# Patient Record
Sex: Female | Born: 1975
Health system: Southern US, Community
[De-identification: ages and names within clinical notes are randomized; demographics above are authoritative.]

## PROBLEM LIST (undated history)

## (undated) DIAGNOSIS — R519 Headache, unspecified: Secondary | ICD-10-CM

## (undated) DIAGNOSIS — Z9049 Acquired absence of other specified parts of digestive tract: Secondary | ICD-10-CM

## (undated) DIAGNOSIS — R51 Headache: Secondary | ICD-10-CM

## (undated) DIAGNOSIS — Z9851 Tubal ligation status: Secondary | ICD-10-CM

## (undated) DIAGNOSIS — E039 Hypothyroidism, unspecified: Secondary | ICD-10-CM

## (undated) HISTORY — DX: Headache, unspecified: R51.9

## (undated) HISTORY — DX: Headache: R51

## (undated) HISTORY — DX: Hypothyroidism, unspecified: E03.9

---

## 2003-07-12 ENCOUNTER — Ambulatory Visit (HOSPITAL_COMMUNITY): Admission: RE | Admit: 2003-07-12 | Discharge: 2003-07-12 | Payer: Self-pay | Admitting: Family Medicine

## 2004-10-30 ENCOUNTER — Inpatient Hospital Stay (HOSPITAL_COMMUNITY): Admission: AD | Admit: 2004-10-30 | Discharge: 2004-11-01 | Payer: Self-pay | Admitting: Obstetrics

## 2005-01-12 ENCOUNTER — Ambulatory Visit: Payer: Self-pay | Admitting: Family Medicine

## 2005-02-25 ENCOUNTER — Ambulatory Visit: Payer: Self-pay | Admitting: Family Medicine

## 2005-03-02 ENCOUNTER — Ambulatory Visit (HOSPITAL_COMMUNITY): Admission: RE | Admit: 2005-03-02 | Discharge: 2005-03-02 | Payer: Self-pay | Admitting: Family Medicine

## 2005-03-09 ENCOUNTER — Ambulatory Visit: Payer: Self-pay | Admitting: Family Medicine

## 2005-04-20 HISTORY — PX: APPENDECTOMY: SHX54

## 2005-05-11 ENCOUNTER — Inpatient Hospital Stay (HOSPITAL_COMMUNITY): Admission: EM | Admit: 2005-05-11 | Discharge: 2005-05-13 | Payer: Self-pay | Admitting: Emergency Medicine

## 2005-05-11 ENCOUNTER — Encounter (INDEPENDENT_AMBULATORY_CARE_PROVIDER_SITE_OTHER): Payer: Self-pay | Admitting: Specialist

## 2005-11-04 ENCOUNTER — Ambulatory Visit: Payer: Self-pay | Admitting: Family Medicine

## 2005-11-09 ENCOUNTER — Ambulatory Visit: Payer: Self-pay | Admitting: *Deleted

## 2006-03-01 ENCOUNTER — Ambulatory Visit: Payer: Self-pay | Admitting: Internal Medicine

## 2006-03-15 ENCOUNTER — Encounter: Payer: Self-pay | Admitting: Internal Medicine

## 2006-03-15 ENCOUNTER — Ambulatory Visit: Payer: Self-pay | Admitting: Internal Medicine

## 2006-04-20 HISTORY — PX: TUBAL LIGATION: SHX77

## 2006-06-18 ENCOUNTER — Ambulatory Visit: Payer: Self-pay | Admitting: Internal Medicine

## 2006-07-28 ENCOUNTER — Emergency Department (HOSPITAL_COMMUNITY): Admission: EM | Admit: 2006-07-28 | Discharge: 2006-07-28 | Payer: Self-pay | Admitting: Emergency Medicine

## 2006-09-02 ENCOUNTER — Encounter: Admission: RE | Admit: 2006-09-02 | Discharge: 2006-09-02 | Payer: Self-pay | Admitting: Obstetrics

## 2007-01-05 ENCOUNTER — Encounter (INDEPENDENT_AMBULATORY_CARE_PROVIDER_SITE_OTHER): Payer: Self-pay | Admitting: *Deleted

## 2007-02-04 ENCOUNTER — Inpatient Hospital Stay (HOSPITAL_COMMUNITY): Admission: RE | Admit: 2007-02-04 | Discharge: 2007-02-07 | Payer: Self-pay | Admitting: Obstetrics

## 2007-02-04 ENCOUNTER — Encounter (INDEPENDENT_AMBULATORY_CARE_PROVIDER_SITE_OTHER): Payer: Self-pay | Admitting: Obstetrics

## 2007-03-11 ENCOUNTER — Inpatient Hospital Stay (HOSPITAL_COMMUNITY): Admission: AD | Admit: 2007-03-11 | Discharge: 2007-03-11 | Payer: Self-pay | Admitting: Obstetrics

## 2007-03-29 ENCOUNTER — Telehealth (INDEPENDENT_AMBULATORY_CARE_PROVIDER_SITE_OTHER): Payer: Self-pay | Admitting: *Deleted

## 2010-09-02 NOTE — Discharge Summary (Signed)
NAMEILY, DENNO                    ACCOUNT NO.:  [id number]   MEDICAL RECORD NO.:  [id number]          PATIENT TYPE:  INP   LOCATION:  9105                          FACILITY:  [facility]   PHYSICIAN:  Kathreen Cosier, M.D.DATE OF BIRTH:  05-16-1975   DATE OF ADMISSION:  02/04/2007  DATE OF DISCHARGE:  02/07/2007                               DISCHARGE SUMMARY   The patient is a 35 year old gravida 4, para 3-0-0-3; [facility] February 15, 2007.  She had previous C-section at term and desired repeat C-section  and tubal ligation.  She underwent repeat C-section, had a female, Apgar 8  and 9, weighing 7 pounds 3 ounces and tubal ligation was also performed.  Postoperatively, she did well.  On admission her hemoglobin was 11.1 and  9.4 postoperatively.  Platelets 227 and 189.  PT/PTT normal.  RPR  negative.  She was discharged home on the third postoperative day  ambulatory on a regular diet to see me in 6 weeks.   DISCHARGE DIAGNOSIS:  Status post repeat low transverse cesarean section  and tubal ligation at term.           ______________________________  Kathreen Cosier, M.D.     BAM/MEDQ  D:  03/02/2007  T:  03/02/2007  Job:  [id number]

## 2010-09-02 NOTE — Op Note (Signed)
NAMEEVOLEHT, HOVATTER                    ACCOUNT NO.:  [id number]   MEDICAL RECORD NO.:  [id number]          PATIENT TYPE:  INP   LOCATION:  9199                          [facility]ACILITY:  [facility]   PHYSICIAN:  Kathreen Cosier, M.D.DATE OF BIRTH:  12-Dec-1975   DATE OF PROCEDURE:  02/04/2007  DATE OF DISCHARGE:                               OPERATIVE REPORT   PREOPERATIVE DIAGNOSIS:  Previous cesarean section at term, desires  repeat and tubal ligation.   SURGEON:  Kathreen Cosier, M.D.   FIRST ASSISTANT:  Charles A. Clearance Coots, M.D.   ANESTHESIA:  Spinal.   PROCEDURE:  The patient placed on the operating table in supine  position.  After spinal administered, abdomen prepped and draped.  The  bladder emptied with a Foley catheter.   A transverse suprapubic incision made through the old scar and carried  down to the rectus fascia.  The fascia cleaned and incised the length of  the incision.  Recti muscles retracted laterally.  Peritoneum incised  longitudinally.  Transverse incision made in the visceral peritoneum  above the bladder.  Bladder mobilized inferiorly.  Transverse lower  uterine incision made.  Fluid clear.  The patient delivered from the LOA  position of a female, Apgars 8 and 9, weighing 7 pounds 3 ounces.  Placenta anterior, removed manually.  Uterine cavity cleaned with dry  laps.  Uterine incision closed in one layer with continuous suture of #1  chromic.  Hemostasis satisfactory.  Bladder flap reattached with 2-0  chromic.  Uterus well-contracted.  Tubes and ovaries normal.   Right tube grasped in midportion with a Babcock clamp.  0 plain suture  placed in the mesosalpinx below the portion of tube within clamp.  This  was tied and approximately 1 inch of tube transected.  Hemostasis  satisfactory.  Procedure done in a similar fashion on the other side.  Abdomen closed in layers, peritoneum continuous suture of 0 chromic,  fascia continuous suture of 0 Dexon, skin closed with  subcuticular  stitch of 4-0 Monocryl.  Blood loss 500 mL.           ______________________________  Kathreen Cosier, M.D.     BAM/MEDQ  D:  02/04/2007  T:  02/06/2007  Job:  [id number]

## 2010-09-05 NOTE — H&P (Signed)
Tracy Blevins, Tracy Blevins               ACCOUNT NO.:  [id number]   MEDICAL RECORD NO.:  [id number]          PATIENT TYPE:  INP   LOCATION:  9176                          [facility]AC[facility]:  [facility]   PHYSICIAN:  Roseanna Rainbow, M.D.DATE OF BIRTH:  December 19, 1975   DATE OF ADMISSION:  10/30/2004  DATE OF DISCHARGE:                                HISTORY & PHYSICAL   CHIEF COMPLAINT:  The patient is a 35 year old, para 2 with an estimated  date of confinement of July 12 now at 40 1/7 weeks complaining of uterine  contractions.   HISTORY OF PRESENT ILLNESS:  Please see the above. She also reports  decreased fetal movement.   ANTEPARTUM PROBLEMS/RISK FACTORS:  Prenatal care with Dr. Gaynell Face. Onset of  care at 20 weeks. She has a history of a previous cesarean delivery with a  successful VBAC.   PRENATAL SCREENS:  Hemoglobin 10.7, hematocrit 32.8, platelets 293,000,  blood type A+, antibody screen negative, RPR nonreactive, rubella immune.  Hepatitis B surface antigen negative, HIV declined. PPD negative, Pap test  within normal limits, gonorrhea and chlamydia negative. One hour GTT 71. GBS  negative on June 4.   PAST OB/GYN HISTORY:  In 2000 she is delivered of a female 6 pounds 8 ounces  via cesarean delivery secondary to a breech presentation. In 2001, she was  delivered of a female 7 pounds fullterm spontaneous vaginal delivery.   PAST MEDICAL HISTORY:  She denies.   PAST SURGICAL HISTORY:  Please see the above.   FAMILY HISTORY:  She denies.   SOCIAL HISTORY:  She is single, she denies any tobacco, ethanol or substance  use.   ALLERGIES:  No known drug allergies.   MEDICATIONS:  Prenatal vitamins.   PHYSICAL EXAMINATION:  VITAL SIGNS:  Stable, afebrile. Blood pressure  116/67.  GENERAL:  Uncomfortable, sterile vaginal exam is per the RN and by me 3-4 cm  dilated, 80% effaced with a vertex at a 0 station. Fetal heart tracing  reassuring. The membranes were artificially ruptured during  the exam with  blood tinged clear fluid.   ASSESSMENT:  Multipara at term with a history of a cesarean delivery and  subsequent VBAC for repeat trial of labor, late latent versus early active  labor. Fetal heart tracing consistent with fetal well being.   PLAN:  Admission, expectant management.       LAJ/MEDQ  D:  10/30/2004  T:  10/30/2004  Job:  [id number]

## 2010-09-05 NOTE — Op Note (Signed)
Tracy Blevins, Tracy Blevins               ACCOUNT NO.:  [id number]   MEDICAL RECORD NO.:  [id number]          PATIENT TYPE:  INP   LOCATION:  5705                         FACILITY:  [facility]   PHYSICIAN:  Velora Heckler, MD      DATE OF BIRTH:  02/11/1976   DATE OF PROCEDURE:  05/11/2005  DATE OF DISCHARGE:                                 OPERATIVE REPORT   PREOPERATIVE DIAGNOSIS:  Acute appendicitis.   POSTOPERATIVE DIAGNOSIS:  Acute appendicitis.   PROCEDURE:  Laparoscopic appendectomy.   SURGEON:  Velora Heckler, MD, FACS   ASSISTANT:  None.   ANESTHESIA:  General.   ESTIMATED BLOOD LOSS:  Minimal.   PREPARATION:  Betadine.   COMPLICATIONS:  None.   INDICATIONS:  The patient is 35 year old Hispanic female presents to the  emergency department with 24-hour history of abdominal pain localized to the  right lower quadrant associated with nausea and vomiting. The patient  developed anorexia. She was seen in the emergency department and noted to  have elevated white blood cell count. CT scan abdomen and pelvis  demonstrated retrocecal appendicitis. General surgery was consulted. The  patient is now prepared for the operating room.   BODY OF REPORT:  The procedure was done in OR number 16 at [facility]. The patient was brought to the operating room, placed in  supine position on the operating room table. Following administration of  general anesthesia the patient is prepped and draped in usual strict aseptic  fashion. After ascertaining that an adequate level of anesthesia been  obtained, a supraumbilical incision was made with a #15 blade. Dissection  was carried down to the fascia. Incision was made the midline fascia and the  peritoneal cavity is entered cautiously. A 0 Vicryl pursestring suture was  placed in the fascia. A Hasson cannula was introduced and secured with  pursestring suture. Abdomen is insufflated carbon dioxide. Laparoscope was  introduced  and the abdomen explored. Operative ports were placed in the  right upper quadrant and left lower quadrant under direct vision. Appendix  is retrocecal. The terminal ileum was mobilized from the lateral peritoneal  attachments by incising the peritoneum with the harmonic scalpel. This  provides for some mobilization of the cecum. The base of the appendix was  identified. A window is created at the base of the appendix using the  Harmonic scalpel for hemostasis. An Endo-GIA stapler was inserted and passed  across the base of the appendix and fired. The mesoappendix was then taken  down with the Harmonic scalpel. Dissection was carried posterior laterally  along the wall of the cecum, taking care to avoid the bowel with the  harmonic scalpel. The appendix was completely freed from the posterior  aspect of the cecum and delivered up and into the peritoneal cavity. It is  placed into an EndoCatch bag and withdrawn through the umbilical port  without difficulty. It was submitted to pathology for review. Right lower  quadrant was copiously irrigated with warm saline. Good hemostasis is noted  along the staple line. Good hemostasis was also  noted along the  mesoappendix. Area was irrigated copiously with warm saline which was  evacuated. Ports were removed under direct vision and good hemostasis was  noted at all port sites. Pneumoperitoneum was evacuated.  0 Vicryl  pursestring sutures tied securely. Port sites were anesthetized with local  anesthetic. All three wounds were closed with interrupted 4-0 Vicryl  subcuticular sutures. Wounds washed and dried and benzoin, Steri-Strips were  applied. Sterile dressings were applied. The patient is awakened from  anesthesia and brought to the recovery room in stable condition. The patient  tolerated the procedure well.      Velora Heckler, MD  Electronically Signed     TMG/MEDQ  D:  05/11/2005  T:  05/12/2005  Job:  [id number]   cc:   Lear Ng, MD  Fax: [phone]

## 2010-09-05 NOTE — H&P (Signed)
Tracy Blevins, Tracy Blevins               ACCOUNT NO.:  [id number]   MEDICAL RECORD NO.:  [id number]          PATIENT TYPE:  INP   LOCATION:  5705                         FACILITY:  [facility]   PHYSICIAN:  Velora Heckler, MD      DATE OF BIRTH:  [birth year]   DATE OF ADMISSION:  05/11/2005  DATE OF DISCHARGE:                                HISTORY & PHYSICAL   CHIEF COMPLAINT:  Right lower quadrant abdominal pain less than 24 hours.   HISTORY OF PRESENT ILLNESS:  [name] is a non-English speaking 35-year-  old Hispanic female patient who developed right lower quadrant pain  yesterday mid day.  This pain has become more progressive since Sunday.  It  has been associated with nausea and vomiting.  She attempted to eat a light  meal at 8 p.m. yesterday, but became anorexic and vomited.  The pain  increased in severity this morning and she presented to the ER.  Clinical  examination was consistent with probable appendicitis with positive right  lower quadrant tenderness with guarding and no rebounding over the  McBurney's point.  She had an elevated white count of greater than 15,000.  Subsequent CT of the abdomen and pelvis did demonstrate retrocecal  appendicitis.   REVIEW OF SYSTEMS:  Patient denies problems with prior abdominal pain or  reflux.  No recent problems with viral syndrome, upper respiratory  infection, fevers, chills, or myalgias.  No hematemesis, hematochezia,  hematuria, or melena.  No lower extremity swelling.  No tachy palpitations.  Please refer to the history of present illness.   SOCIAL HISTORY:  She is not English-speaking.  Her husband is translating at  the bedside.  She does not drink alcohol.  She does not smoke.  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   PAST MEDICAL HISTORY:  None.   PAST SURGICAL HISTORY:  1.  Cesarean section in 2000.  2.  Vaginal delivery in 2001 and 2006.   FAMILY HISTORY:   Noncontributory.   ALLERGIES:  NKDA.   MEDICATIONS:  None.   PHYSICAL EXAMINATION:  GENERAL:  Pleasant female patient complaining of  right lower quadrant abdominal pain worse with movement or palpation.  VITAL SIGNS:  Initial temperature was 100.6, down to 98.1.  Initial pulse  114 down to 71.  This is after pain medications and IV hydration.  Respirations 20 to 24.  Initial BP 108/66 down to 92/66 after IV pain  medications.  Patient has received 1.5 L of Iv fluids in the ER.  HEENT:  Head is normocephalic.  Sclerae not injected.  NECK:  Supple.  No adenopathy.  CHEST:  Bilateral lung sounds are clear to auscultation.  Respiratory effort  unlabored on room air.  CARDIAC:  S1, S2.  No rubs, murmurs, thrills, no gallops.  No JVD.  Carotids  2+.  No bruits.  ABDOMEN:  Soft.  Bowel sounds are present.  There is right lower quadrant  tenderness with guarding.  No rebound in the right lower quadrant  over the  McBurney's point.  No involuntary guarding.  No obvious hepatosplenomegaly.  No masses.  No bruits.  EXTREMITIES:  Symmetrical in appearance without edema, cyanosis, or  clubbing.  Pulses are palpable 2+ bilaterally radial, femoral, and pedal.  SKIN:  Intact without evidence of rashes or lesions.   LABORATORIES AND X-RAY:  A CT of the abdomen has been performed that shows  retrocecal appendicitis.  Urinalysis is negative.  Urine pregnancy is  negative.  Sodium 134, potassium 3.7, CO2 19, glucose 105, BUN 11,  creatinine 0.7.  LFTs are normal.  Lipase 23.  Wet prep negative.  White  count 15,500.  Hemoglobin 11.7, platelets 220,000.  PT 13.9, INR 1.1.   IMPRESSION:  1.  Acute appendicitis.  2.  Metabolic acidosis secondary to recent nausea and vomiting.   PLAN:  1.  Laparoscopic appendectomy per Dr. Gerrit Friends.  Risks and benefits of this      procedure have been explained occupational therapy the patient via her      husband by Dr. Gerrit Friends and they wish to proceed.  Will pursue  this this      afternoon on May 11, 2005.  2.  IV fluid hydration.  D5 normal saline with 20 K at 150/hour.  3.  IV Unasyn 3 g IV q.6h. first dose now preoperatively.  4.  Continue n.p.o. status and will admit for overnight evaluation, probable      advanced diet in the morning if surgery otherwise uncomplicated.      Allison L. Rennis Harding, N.P.      Velora Heckler, MD  Electronically Signed    ALE/MEDQ  D:  05/11/2005  T:  05/11/2005  Job:  [id number]

## 2010-09-26 ENCOUNTER — Inpatient Hospital Stay (INDEPENDENT_AMBULATORY_CARE_PROVIDER_SITE_OTHER)
Admission: RE | Admit: 2010-09-26 | Discharge: 2010-09-26 | Disposition: A | Discharge status: Home / Self Care | Payer: Self-pay | Source: Ambulatory Visit | Attending: Family Medicine | Admitting: Family Medicine

## 2010-09-26 DIAGNOSIS — M545 Low back pain: Secondary | ICD-10-CM

## 2011-01-23 ENCOUNTER — Emergency Department (HOSPITAL_COMMUNITY)
Admission: EM | Admit: 2011-01-23 | Discharge: 2011-01-23 | Disposition: A | Discharge status: Home / Self Care | Payer: Self-pay | Attending: Emergency Medicine | Admitting: Emergency Medicine

## 2011-01-23 ENCOUNTER — Encounter (HOSPITAL_COMMUNITY): Payer: Self-pay | Admitting: Radiology

## 2011-01-23 ENCOUNTER — Emergency Department (HOSPITAL_COMMUNITY): Payer: Self-pay

## 2011-01-23 DIAGNOSIS — R319 Hematuria, unspecified: Secondary | ICD-10-CM | POA: Insufficient documentation

## 2011-01-23 DIAGNOSIS — R509 Fever, unspecified: Secondary | ICD-10-CM | POA: Insufficient documentation

## 2011-01-23 DIAGNOSIS — R10813 Right lower quadrant abdominal tenderness: Secondary | ICD-10-CM | POA: Insufficient documentation

## 2011-01-23 DIAGNOSIS — R109 Unspecified abdominal pain: Secondary | ICD-10-CM | POA: Insufficient documentation

## 2011-01-23 DIAGNOSIS — R110 Nausea: Secondary | ICD-10-CM | POA: Insufficient documentation

## 2011-01-23 DIAGNOSIS — N390 Urinary tract infection, site not specified: Secondary | ICD-10-CM | POA: Insufficient documentation

## 2011-01-23 HISTORY — DX: Acquired absence of other specified parts of digestive tract: Z90.49

## 2011-01-23 HISTORY — DX: Tubal ligation status: Z98.51

## 2011-01-23 LAB — CBC
HCT: 35.7 % — ABNORMAL LOW (ref 36.0–46.0)
Hemoglobin: 12.4 g/dL (ref 12.0–15.0)
MCH: 29.1 pg (ref 26.0–34.0)
MCHC: 34.7 g/dL (ref 30.0–36.0)
MCV: 83.8 fL (ref 78.0–100.0)
Platelets: 195 10*3/uL (ref 150–400)
RBC: 4.26 MIL/uL (ref 3.87–5.11)
RDW: 13.1 % (ref 11.5–15.5)
WBC: 12.5 10*3/uL — ABNORMAL HIGH (ref 4.0–10.5)

## 2011-01-23 LAB — URINALYSIS, ROUTINE W REFLEX MICROSCOPIC
Bilirubin Urine: NEGATIVE
Glucose, UA: NEGATIVE mg/dL
Ketones, ur: 15 mg/dL — AB
Nitrite: POSITIVE — AB
Protein, ur: 30 mg/dL — AB
Urobilinogen, UA: 0.2 mg/dL (ref 0.0–1.0)
pH: 7 (ref 5.0–8.0)

## 2011-01-23 LAB — DIFFERENTIAL
Basophils Absolute: 0 10*3/uL (ref 0.0–0.1)
Eosinophils Relative: 0 % (ref 0–5)
Lymphocytes Relative: 10 % — ABNORMAL LOW (ref 12–46)
Lymphs Abs: 1.2 10*3/uL (ref 0.7–4.0)
Monocytes Absolute: 1.2 10*3/uL — ABNORMAL HIGH (ref 0.1–1.0)
Monocytes Relative: 10 % (ref 3–12)
Neutro Abs: 10 10*3/uL — ABNORMAL HIGH (ref 1.7–7.7)
Neutrophils Relative %: 80 % — ABNORMAL HIGH (ref 43–77)

## 2011-01-23 LAB — URINE MICROSCOPIC-ADD ON

## 2011-01-23 LAB — COMPREHENSIVE METABOLIC PANEL
AST: 30 U/L (ref 0–37)
Albumin: 3.8 g/dL (ref 3.5–5.2)
BUN: 13 mg/dL (ref 6–23)
CO2: 24 mEq/L (ref 19–32)
Chloride: 101 mEq/L (ref 96–112)
Creatinine, Ser: 0.68 mg/dL (ref 0.50–1.10)
GFR calc Af Amer: >90 mL/min (ref >90)
GFR calc non Af Amer: >90 mL/min (ref >90)
Total Bilirubin: 0.7 mg/dL (ref 0.3–1.2)

## 2011-01-23 LAB — GLUCOSE, CAPILLARY: Glucose-Capillary: 110 mg/dL — ABNORMAL HIGH (ref 70–99)

## 2011-01-23 LAB — POCT PREGNANCY, URINE: Preg Test, Ur: NEGATIVE

## 2011-01-28 LAB — CBC
HCT: 26.5 — ABNORMAL LOW
HCT: 32.1 — ABNORMAL LOW
Hemoglobin: 11.1 — ABNORMAL LOW
Hemoglobin: 9.4 — ABNORMAL LOW
MCHC: 34.7
MCHC: 35.3
MCV: 88.6
MCV: 88.8
Platelets: 189
Platelets: 227
RBC: 2.98 — ABNORMAL LOW
RBC: 3.62 — ABNORMAL LOW
RDW: 13.3
RDW: 13.5
WBC: 7.8
WBC: 8.2

## 2011-01-28 LAB — DIFFERENTIAL
Basophils Absolute: 0
Basophils Relative: 0
Eosinophils Absolute: 0.1
Eosinophils Relative: 1
Lymphocytes Relative: 27
Lymphs Abs: 2.1
Monocytes Absolute: 0.4
Monocytes Relative: 5
Neutro Abs: 5.2
Neutrophils Relative %: 67

## 2011-01-28 LAB — PROTIME-INR
INR: 0.9
Prothrombin Time: 12.1

## 2011-01-28 LAB — APTT: aPTT: 27

## 2011-01-28 LAB — RPR: RPR Ser Ql: NONREACTIVE

## 2013-08-18 ENCOUNTER — Ambulatory Visit: Payer: Self-pay | Attending: Internal Medicine

## 2014-09-24 ENCOUNTER — Ambulatory Visit: Payer: Self-pay

## 2014-10-24 ENCOUNTER — Ambulatory Visit: Payer: Self-pay | Attending: Internal Medicine

## 2016-06-18 ENCOUNTER — Emergency Department (HOSPITAL_COMMUNITY): Payer: Self-pay

## 2016-06-18 ENCOUNTER — Other Ambulatory Visit: Payer: Self-pay

## 2016-06-18 ENCOUNTER — Encounter (HOSPITAL_COMMUNITY): Payer: Self-pay

## 2016-06-18 DIAGNOSIS — F419 Anxiety disorder, unspecified: Secondary | ICD-10-CM | POA: Insufficient documentation

## 2016-06-18 LAB — CBC
HCT: 34 % — ABNORMAL LOW (ref 36.0–46.0)
HEMOGLOBIN: 11.2 g/dL — AB (ref 12.0–15.0)
MCH: 27.7 pg (ref 26.0–34.0)
MCHC: 32.9 g/dL (ref 30.0–36.0)
MCV: 84 fL (ref 78.0–100.0)
PLATELETS: 273 10*3/uL (ref 150–400)
RBC: 4.05 MIL/uL (ref 3.87–5.11)
RDW: 13.6 % (ref 11.5–15.5)
WBC: 7.6 10*3/uL (ref 4.0–10.5)

## 2016-06-18 LAB — BASIC METABOLIC PANEL
ANION GAP: 12 (ref 5–15)
BUN: 14 mg/dL (ref 6–20)
CHLORIDE: 103 mmol/L (ref 101–111)
CO2: 23 mmol/L (ref 22–32)
CREATININE: 0.66 mg/dL (ref 0.44–1.00)
Calcium: 9.5 mg/dL (ref 8.9–10.3)
GFR calc non Af Amer: >60 mL/min (ref >60)
Glucose, Bld: 92 mg/dL (ref 65–99)
POTASSIUM: 3.6 mmol/L (ref 3.5–5.1)
SODIUM: 138 mmol/L (ref 135–145)

## 2016-06-18 LAB — HCG, QUANTITATIVE, PREGNANCY: hCG, Beta Chain, Quant, S: <1 m[IU]/mL (ref <5)

## 2016-06-18 LAB — TROPONIN I: Troponin I: <0.03 ng/mL (ref <0.03)

## 2016-06-18 NOTE — ED Triage Notes (Addendum)
Pt endorses left side chest pain with radiation to the left shoulder and left side of the neck that came on yesterday, then went away last night and came back at noon today. . Pt also endorses nausea and shob. Pt speaks limited english. Breath sounds clear. VSS.

## 2016-06-19 ENCOUNTER — Emergency Department (HOSPITAL_COMMUNITY)
Admission: EM | Admit: 2016-06-19 | Discharge: 2016-06-19 | Disposition: A | Discharge status: Home / Self Care | Payer: Self-pay | Attending: Emergency Medicine | Admitting: Emergency Medicine

## 2016-06-19 DIAGNOSIS — R0789 Other chest pain: Secondary | ICD-10-CM

## 2016-06-19 DIAGNOSIS — F419 Anxiety disorder, unspecified: Secondary | ICD-10-CM

## 2016-06-19 LAB — I-STAT TROPONIN, ED: TROPONIN I, POC: 0 ng/mL (ref 0.00–0.08)

## 2016-06-19 MED ORDER — HYDROXYZINE HCL 25 MG PO TABS: 25.0000 mg | ORAL_TABLET | ORAL | 0 refills | Status: DC | PRN

## 2016-06-19 MED ORDER — HYDROXYZINE HCL 25 MG PO TABS
50.0000 mg | ORAL_TABLET | Freq: Once | ORAL | Status: AC
  Administered 2016-06-19: 50 mg via ORAL
  Filled 2016-06-19: qty 2

## 2016-06-19 NOTE — Discharge Instructions (Addendum)
Please follow with your primary care doctor in the next 2 days for a check-up. They must obtain records for further management.  ° °Do not hesitate to return to the Emergency Department for any new, worsening or concerning symptoms.  ° °

## 2016-06-19 NOTE — ED Provider Notes (Signed)
[facility]-EMERGENCY DEPT Provider Note   CSN: [id number] Arrival date & time: 06/18/16  2154     History   Chief Complaint Chief Complaint  Patient presents with  . Chest Pain   HPI   Blood pressure 122/74, pulse 69, temperature 97.6 F (36.4 C), temperature source Oral, resp. rate 21, height 5\' 7"  (1.702 m), weight 70.3 kg, last menstrual period 06/11/2016, SpO2 100 %.  Tracy Blevins is a 41 y.o. female complaining of left-sided chest pain onset this afternoon, now completely resolved. He states that initially it radiated to the left shoulder and left neck, she's had this intermittently over the last several weeks he came back today. Initially felt nauseous but this is resolved. She denies shortness of breath (note that this contradicts triage note), positive family history of ACS with father having heart attacks. States she feels stressed and jittery and is requesting anxiety medication.   Past Medical History:  Diagnosis Date  . S/P appy   . S/P tubal ligation     There are no active problems to display for this patient.   History reviewed. No pertinent surgical history.  OB History    No data available      Home Medications    Prior to Admission medications   Medication Sig Start Date End Date Taking? Authorizing Provider  hydrOXYzine (ATARAX/VISTARIL) 25 MG tablet Take 1 tablet (25 mg total) by mouth every 4 (four) hours as needed for anxiety. 06/19/16   Joni Reining Sherwin Hollingshed, PA-C    Family History History reviewed. No pertinent family history.  Social History Social History  Substance Use Topics  . Smoking status: Never Smoker  . Smokeless tobacco: Never Used  . Alcohol use No     Allergies   Patient has no known allergies.   Review of Systems Review of Systems  10 systems reviewed and found to be negative, except as noted in the HPI.   Physical Exam Updated Vital Signs BP 111/78   Pulse 68   Temp 97.6 F (36.4 C) (Oral)   Resp 17   Ht 5\' 7"   (1.702 m)   Wt 70.3 kg   LMP 06/11/2016 (Approximate)   SpO2 100%   BMI 24.28 kg/m   Physical Exam  Constitutional: She is oriented to person, place, and time. She appears well-developed and well-nourished. No distress.  HENT:  Head: Normocephalic and atraumatic.  Mouth/Throat: Oropharynx is clear and moist.  Eyes: Conjunctivae and EOM are normal. Pupils are equal, round, and reactive to light.  Neck: Normal range of motion. No JVD present. No tracheal deviation present.  Cardiovascular: Normal rate, regular rhythm and intact distal pulses.   Radial pulse equal bilaterally  Pulmonary/Chest: Effort normal and breath sounds normal. No stridor. No respiratory distress. She has no wheezes. She has no rales. She exhibits no tenderness.  Abdominal: Soft. She exhibits no distension and no mass. There is no tenderness. There is no rebound and no guarding.  Musculoskeletal: Normal range of motion. She exhibits no edema or tenderness.  No calf asymmetry, superficial collaterals, palpable cords, edema, Homans sign negative bilaterally.    Neurological: She is alert and oriented to person, place, and time.  Skin: Skin is warm. She is not diaphoretic.  Psychiatric: She has a normal mood and affect.  Nursing note and vitals reviewed.    ED Treatments / Results  Labs (all labs ordered are listed, but only abnormal results are displayed) Labs Reviewed  CBC - Abnormal; Notable for the following:  Result Value   Hemoglobin 11.2 (*)    HCT 34.0 (*)    All other components within normal limits  BASIC METABOLIC PANEL  TROPONIN I  HCG, QUANTITATIVE, PREGNANCY  I-STAT TROPOININ, ED    EKG  EKG Interpretation  Date/Time:  Friday June 19 2016 02:48:17 EST Ventricular Rate:  60 PR Interval:  162 QRS Duration: 118 QT Interval:  408 QTC Calculation: 408 R Axis:   16 Text Interpretation:  Sinus rhythm Atrial premature complex Nonspecific intraventricular conduction delay No significant  change since last tracing Confirmed by WARD,  DO, KRISTEN [phone]([id number]) on 06/19/2016 2:50:05 AM       Radiology Dg Chest 2 View  Result Date: 06/18/2016 CLINICAL DATA:  Left-sided chest pain. Nausea and shortness of breath. EXAM: CHEST  2 VIEW COMPARISON:  None. FINDINGS: Low lung volumes. The cardiomediastinal contours are normal. Mild streaky bibasilar atelectasis. Pulmonary vasculature is normal. No consolidation, pleural effusion, or pneumothorax. No acute osseous abnormalities are seen. IMPRESSION: Low lung volumes with streaky bibasilar atelectasis. Electronically Signed   By: Rubye OaksMelanie  Ehinger M.D.   On: 06/18/2016 22:27    Procedures Procedures (including critical care time)  Medications Ordered in ED Medications  hydrOXYzine (ATARAX/VISTARIL) tablet 50 mg (50 mg Oral Given 06/19/16 0249)     Initial Impression / Assessment and Plan / ED Course  I have reviewed the triage vital signs and the nursing notes.  Pertinent labs & imaging results that were available during my care of the patient were reviewed by me and considered in my medical decision making (see chart for details).     Vitals:   06/19/16 0152 06/19/16 0200 06/19/16 0230 06/19/16 0418  BP: 107/65 113/73 122/74 111/78  Pulse: 74 67 69 68  Resp:  23 21 17   Temp:      TempSrc:      SpO2: 98% 98% 100% 100%  Weight:      Height:        Medications  hydrOXYzine (ATARAX/VISTARIL) tablet 50 mg (50 mg Oral Given 06/19/16 0249)    Tracy Blevins is 41 y.o. female presenting with  chest pain, now fully resolved. EKG without acute abnormality, Trop and CXR negative. Chest pain has fully resolved. This patient is low risk by heart score. She does feel anxious, I've given her Atarax for comfort. Will check delta troponin.  Case management is consulted patient can establish outpatient primary care. She's remained chest pain-free she feels much better after the Atarax. Delta troponin negative.  Evaluation does not show  pathology that would require ongoing emergent intervention or inpatient treatment. Pt is hemodynamically stable and mentating appropriately. Discussed findings and plan with patient/guardian, who agrees with care plan. All questions answered. Return precautions discussed and outpatient follow up given.    Final Clinical Impressions(s) / ED Diagnoses   Final diagnoses:  Atypical chest pain  Anxiety    New Prescriptions Discharge Medication List as of 06/19/2016  3:48 AM    START taking these medications   Details  hydrOXYzine (ATARAX/VISTARIL) 25 MG tablet Take 1 tablet (25 mg total) by mouth every 4 (four) hours as needed for anxiety., Starting Fri 06/19/2016, [employer]         Doll Frazee, PA-C 06/19/16 [id number]    Layla MawKristen N Ward, DO 06/23/16 [id number]

## 2016-06-25 ENCOUNTER — Encounter: Payer: Self-pay | Admitting: Physician Assistant

## 2016-06-25 ENCOUNTER — Other Ambulatory Visit: Payer: Self-pay

## 2016-06-25 ENCOUNTER — Ambulatory Visit: Payer: Self-pay | Attending: Internal Medicine | Admitting: Physician Assistant

## 2016-06-25 VITALS — BP 114/77 | HR 89 | Temp 98.6°F | Resp 16 | Wt 180.2 lb

## 2016-06-25 DIAGNOSIS — R002 Palpitations: Secondary | ICD-10-CM | POA: Insufficient documentation

## 2016-06-25 DIAGNOSIS — Z8249 Family history of ischemic heart disease and other diseases of the circulatory system: Secondary | ICD-10-CM | POA: Insufficient documentation

## 2016-06-25 DIAGNOSIS — R079 Chest pain, unspecified: Secondary | ICD-10-CM | POA: Insufficient documentation

## 2016-06-25 DIAGNOSIS — N926 Irregular menstruation, unspecified: Secondary | ICD-10-CM | POA: Insufficient documentation

## 2016-06-25 DIAGNOSIS — Z9851 Tubal ligation status: Secondary | ICD-10-CM | POA: Insufficient documentation

## 2016-06-25 DIAGNOSIS — D649 Anemia, unspecified: Secondary | ICD-10-CM | POA: Insufficient documentation

## 2016-06-25 LAB — IRON AND TIBC
%SAT: 20 % (ref 11–50)
Iron: 60 ug/dL (ref 40–190)
TIBC: 300 ug/dL (ref 250–450)
UIBC: 240 ug/dL (ref 125–400)

## 2016-06-25 LAB — THYROID PANEL WITH TSH
FREE THYROXINE INDEX: 1.9 (ref 1.4–3.8)
T3 UPTAKE: 27 % (ref 22–35)
T4, Total: 7.1 ug/dL (ref 4.5–12.0)
TSH: 1.83 m[IU]/L

## 2016-06-25 LAB — FERRITIN: FERRITIN: 9 ng/mL — AB (ref 10–154)

## 2016-06-25 MED ORDER — HYDROXYZINE HCL 25 MG PO TABS: 25.0000 mg | ORAL_TABLET | ORAL | 0 refills | Status: DC | PRN

## 2016-06-25 NOTE — Patient Instructions (Addendum)
Angina de pecho (Angina Pectoris) La angina de pecho es una sensacin de molestia extrema en el pecho, el cuello o el brazo. El mdico podr llamarla simplemente angina. Hay cuatro tipos de angina de pecho. La angina es causada por la falta de sangre en la capa media y ms gruesa de la pared del corazn (miocardio). Se puede sentir como un dolor que aplasta o retuerce en el pecho. Se puede sentir Insurance account manager u opresin fuerte en el pecho. Algunas personas dicen que se siente como empacho, acidez estomacal o gases. Algunas personas pueden tener sntomas diferentes al [employer] Estos incluyen los siguientes:  Falta de [place].  Sudor fro.  Ganas de vomitar (nuseas).  Sensacin de desvanecimiento. Muchas mujeres tienen [employer] pecho y algunos de los otros sntomas. Sin embargo, a Research scientist (medical), como:  Sensacin de Merchant navy officer (fatiga).  Nerviosismo o preocupacin sin causa aparente.  Debilidad sin causa aparente.  Mareos o [employer]. Las mujeres pueden tener angina sin presentar sntomas. CUIDADOS EN EL HOGAR  Tome los medicamentos solamente como se lo haya indicado el mdico.  Cudese de otras afecciones como se lo haya indicado su mdico. Estos incluyen los siguientes:  Presin arterial elevada (hipertensin).  Diabetes.  Siga una dieta cardiosaludable. El mdico puede ayudarle a elegir opciones de alimentos saludables y a Forensic psychologist.  Consulte a su mdico para conocer ms sobre mtodos de coccin saludables y selos. Estos incluyen los siguientes:  Asar.  Grillar.  Hervir.  Hornear.  Escalfar.  Cocer al vapor.  Saltear.  Siga un programa de ejercicios autorizado por su mdico.  Mantenga un peso saludable. Pierda peso como se lo haya indicado su mdico.  Descanse cuando se sienta cansado.  Aprenda a Dealer.  No consuma ningn producto que contenga tabaco, como cigarrillos, tabaco de Theatre manager o Administrator, Civil Service.  Si necesita ayuda para dejar de fumar, consulte al mdico.  Si bebe alcohol y su mdico lo autoriza, limtese a no ms de 1 vaso por da. Una medida equivale a 12onzas de cerveza, 5onzas de vino o 1onzas de bebidas alcohlicas de alta graduacin.  No consuma drogas.  Concurra a todas las visitas de control como se lo haya indicado el mdico. Esto es importante. No tome los siguientes medicamentos a menos que el mdico lo autorice:  Antiinflamatorios no esteroides (Murriel Hopper), como:  Ibuprofeno.  Naproxeno.  Celecoxib.  Suplementos vitamnicos que contienen vitamina A, vitamina E o ambas.  Tratamientos de reposicin hormonal que contienen estrgeno con o sin progestina. SOLICITE AYUDA DE INMEDIATO SI:  Tiene dolor en el pecho, el cuello, el brazo, la Pablo, el estmago o la espalda que:  Dura ms de unos minutos.  Se repite.  No mejora despus de tomar medicamentos sublinguales (nitroglicerina sublingual).  Tiene alguno de estos sntomas sin ningn motivo:  Gases, Merchant navy officer, empacho.  Sudoracin abundante.  Falta de aire o dificultad para respirar.  Malestar estomacal y vmitos.  Sentirse ms cansado que de [place].  Sentirse nervioso o ms preocupado que lo habitual.  Sensacin de debilidad.  Diarrea.  Se siente mareado o sufre un desmayo de forma repentina.  Se desmaya o pierde el conocimiento. Estos sntomas pueden Customer service manager. No espere hasta que los sntomas desaparezcan. Solicite atencin mdica de inmediato. Comunquese con el servicio de emergencias de su localidad (911 en los [place]). No conduzca por sus propios medios [employer]. Esta informacin no tiene Theme park manager el consejo del mdico. Asegrese de  hacerle al mdico cualquier pregunta que tenga. Document Released: 03/23/2012 Document Revised: 04/27/2014 Document Reviewed: 08/08/2013 Elsevier Interactive Patient Education  2017 [employer]vier  Inc. Palpitaciones (Palpitations) La palpitacin es la sensacin de que el corazn presenta estas caractersticas:  Tiene latidos cardacos irregulares.  Late ms rpido de lo normal.  Tiene un aleteo.  Saltea un latido cardaco. Generalmente no es un problema grave. En algunos casos podra necesitar hacer ms estudios diagnsticos. CUIDADOS EN EL HOGAR  Evite lo siguiente:  La cafena que contienen [address] caf, el t, las Annetta Southgaseosas, los diurticos y las bebidas energizantes.  Chocolate.  Alcohol.  No use productos que contengan tabaco. Estos incluyen cigarrillos, tabaco para mascar y Administrator, Civil Servicecigarrillos electrnicos. Si necesita ayuda para dejar de fumar, consulte al American Expressmdico.  Trate de reducir los niveles de estrs. Lo siguiente puede ser de ayuda:  Education administratorracticar yoga.  Meditacin.  Realizar actividad fsica. Nadar, trotar y caminar son buenas opciones.  Un mtodo que ayuda a usar la mente para Chief Operating Officercontrolar cosas del cuerpo, como los latidos cardacos (biorregulacin).  Descanse y duerma lo suficiente.  Tome los medicamentos de venta libre y los recetados solamente como se lo haya indicado el mdico.  OceanographerConcurra a todas las visitas de control como se lo haya indicado el mdico. Esto es importante. SOLICITE AYUDA SI:  Su corazn sigue latiendo rpido o de forma irregular despus de 24horas.  Las Smith Internationalpalpitaciones le suceden con ms frecuencia. SOLICITE AYUDA DE INMEDIATO SI:  Siente dolor en el pecho.  Le falta el aire.  Siente un dolor de cabeza muy intenso.  Siente mareos.  Pierde el conocimiento (se desmaya). Esta informacin no tiene Theme park managercomo fin reemplazar el consejo del mdico. Asegrese de hacerle al mdico cualquier pregunta que tenga. Document Released: 05/09/2010 Document Revised: 07/29/2015 Document Reviewed: 12/20/2014 Elsevier Interactive Patient Education  2017 [employer] Inc.

## 2016-06-25 NOTE — Progress Notes (Signed)
[address] [name], is a 41 y.o. female  GEX:[id number]  [id number]:[id number]  DOB - 1975/06/29  Subjective:  Chief Complaint and HPI: Tracy Blevins is a 41 y.o. female here today to establish care and for a follow up visit After being seen in the ED on 06/19/2016.  She presented to the ED c/o L sided CP that had been occurring for several weeks and was radiating into the L shoulder and L neck.  +some nausea, no vomiting.  EKG did not show ischemic changes and cardiac enzymes were negative.  Hgb 11.2.  BMP WNL.  She asked for medication for anxiety and was given Atarax which helped with her symptoms.  [employer] interpreters "Tracy Blevins" is translating.  No DOE.  Today she continues to c/o L sided CP.  The pain comes and goes.  She does tell me she has SOB when the pain comes.  The pain initially started in November and would occur 1-2 times/week and has continued but has been occurring even more frequently over the last month.  Someitmes the episodes last a few minutes and sometimes several hours. The pain feels like pressure.  There is also a sensation of irregular heartbeat.  She denies radiating pain to me.  No paresthesias.No weakness.  She denies N/V.  No diaphoresis.  Dad had an MI at age 39.    She denies any new stresses other than her husband has been having some health issues and having to go back and forth to the doctor a lot since around October.    After further investigation, out turns out that she had thyroid problems about 7-8 yrs ago and [place] and [place] meds for a while, but then she felt better and stopped the meds and never had f/up.    She c/o irregular periods now for several months.   [facility] 06/25/2016  Decreased Interest (No Data)  Down, Depressed, Hopeless 1  PHQ - 2 Score 1  Altered sleeping 0  Tired, decreased energy 1  Change in appetite 0  Feeling bad or failure about yourself  0  Trouble concentrating 1  Moving  slowly or fidgety/restless (No Data)  Suicidal thoughts 0  PHQ-9 Score 3   GAD 7 : Generalized Anxiety Score 06/25/2016  Nervous, Anxious, on Edge 2  Control/stop worrying 1  Worry too much - different things 1  Trouble relaxing 1  Restless 1  Easily annoyed or irritable 1  Afraid - awful might happen 0  Total GAD 7 Score 7   ED/Hospital notes reviewed.  EKG with PACs.  Hgb  11.2 Social History:  married Family history:  Dad with MI at 7050; other relatives on dad's side with cardiac issues.  ROS:   Constitutional:  No f/c, No night sweats, No unexplained weight loss. EENT:  No vision changes, No blurry vision, No hearing changes. No mouth, throat, or ear problems.  Respiratory: No cough, No SOB Cardiac: + CP, + palpitations GI:  No abd pain, No N/V/D. GU: No Urinary s/sx Musculoskeletal: No joint pain Neuro: No headache, no dizziness, no motor weakness.  Skin: No rash Endocrine:  No polydipsia. No polyuria.  Psych: Denies SI/HI  No problems updated.  ALLERGIES: No Known Allergies  PAST MEDICAL HISTORY: Past Medical History:  Diagnosis Date  . S/P appy   . S/P tubal ligation     MEDICATIONS AT HOME: Prior to Admission medications   Medication Sig Start Date End Date  Taking? Authorizing Provider  hydrOXYzine (ATARAX/VISTARIL) 25 MG tablet Take 1 tablet (25 mg total) by mouth every 4 (four) hours as needed for anxiety. 06/19/16   Nicole Pisciotta, PA-C     Objective:  EXAM:   Vitals:   06/25/16 1358  BP: 114/77  Pulse: 89  Resp: 16  Temp: 98.6 F (37 C)  TempSrc: Oral  SpO2: 98%  Weight: 180 lb 3.2 oz (81.7 kg)    General appearance : A&OX3. NAD. Non-toxic-appearing HEENT: Atraumatic and Normocephalic.  PERRLA. EOM intact.  TM clear B. Mouth-MMM, post pharynx WNL w/o erythema, No PND. Neck: supple, no JVD. No cervical lymphadenopathy. No thyromegaly Chest/Lungs:  Breathing-non-labored, Good air entry bilaterally, breath sounds normal without rales,  rhonchi, or wheezing  CVS: Regular rate, irregular rhythm, no murmurs, gallops, rubs  Abdomen: Bowel sounds present, Non tender and not distended with no gaurding, rigidity or rebound. Extremities: Bilateral Lower Ext shows no edema, both legs are warm to touch with = pulse throughout Neurology:  CN II-XII grossly intact, Non focal.   Psych:  TP linear. J/I WNL. Normal speech. Appropriate eye contact and affect.  Skin:  No Rash  Data Review No results found for: HGBA1C   EKG with no acute ischemic changes; +PVC; reviewed with Dr Hyman Hopes   Assessment & Plan   1. Palpitations and CP-likely thyroid is etiology.  Discussed case and reviewed EKG with Dr Hyman Hopes. - Thyroid panel with TSH -echo Consider referral to cardiology. Baby aspirin daily  2. Irregular periods Check thyroid panel with TSH  3. Low hemoglobin - Ferritin - Iron and TIBC  Patient have been counseled extensively about nutrition and exercise  Return in about 2 weeks (around 07/09/2016) for assign PCP; f/up CP. appt with Dr Hyman Hopes.   The patient was given clear instructions to go to ER or return to medical center if symptoms don't improve, worsen or new problems develop. The patient verbalized understanding. The patient was told to call to get lab results if they haven't heard anything in the next week.     Georgian Co, PA-C [facility] [place], [place] [phone]   06/25/2016, 2:15 PM

## 2016-06-26 ENCOUNTER — Other Ambulatory Visit: Payer: Self-pay | Admitting: Physician Assistant

## 2016-06-26 MED ORDER — FERROUS SULFATE 325 (65 FE) MG PO TABS: 325.0000 mg | ORAL_TABLET | Freq: Every day | ORAL | 3 refills | Status: DC

## 2016-06-26 MED FILL — ?HYDROXYZINE HCL 25 MG TAB: 25 MG | 3 days supply | Qty: 20 | Fill #0

## 2016-06-26 MED FILL — FERROUS SULFATE 325 MG TAB: 325 (65 FE) | 30 days supply | Qty: 30 | Fill #0

## 2016-07-01 ENCOUNTER — Ambulatory Visit: Payer: Self-pay | Attending: Internal Medicine

## 2016-07-03 ENCOUNTER — Telehealth: Payer: Self-pay

## 2016-07-03 NOTE — Telephone Encounter (Signed)
CMA call to inform patient about lab results  Patient Verify DOB  Patient was aware and understood   

## 2016-07-03 NOTE — Telephone Encounter (Signed)
-----   Message from Anders SimmondsAngela M McClung, [place] JerseyPA-C sent at 06/26/2016  1:56 PM EST ----- Please call patient.  Her thyroid tests were normal.  Her Iron is a little low.  I sent her a prescription to our pharmacy for her to start taking 1 tablet daily.  Increase water intake.  Keep follow-up appt as planned. Thanks, Georgian CoAngela McClung, PA-C

## 2016-07-08 ENCOUNTER — Ambulatory Visit (HOSPITAL_COMMUNITY)
Admission: RE | Admit: 2016-07-08 | Discharge: 2016-07-08 | Disposition: A | Discharge status: Home / Self Care | Payer: Self-pay | Source: Ambulatory Visit | Attending: Physician Assistant | Admitting: Physician Assistant

## 2016-07-08 ENCOUNTER — Ambulatory Visit: Payer: Self-pay | Attending: Family Medicine | Admitting: Family Medicine

## 2016-07-08 VITALS — BP 119/76 | HR 71 | Temp 98.2°F | Resp 18 | Ht 67.0 in | Wt 180.4 lb

## 2016-07-08 DIAGNOSIS — R079 Chest pain, unspecified: Secondary | ICD-10-CM | POA: Insufficient documentation

## 2016-07-08 DIAGNOSIS — D649 Anemia, unspecified: Secondary | ICD-10-CM | POA: Insufficient documentation

## 2016-07-08 DIAGNOSIS — Z87898 Personal history of other specified conditions: Secondary | ICD-10-CM

## 2016-07-08 DIAGNOSIS — R002 Palpitations: Secondary | ICD-10-CM | POA: Insufficient documentation

## 2016-07-08 DIAGNOSIS — R0602 Shortness of breath: Secondary | ICD-10-CM | POA: Insufficient documentation

## 2016-07-08 DIAGNOSIS — F419 Anxiety disorder, unspecified: Secondary | ICD-10-CM | POA: Insufficient documentation

## 2016-07-08 DIAGNOSIS — R790 Abnormal level of blood mineral: Secondary | ICD-10-CM

## 2016-07-08 MED ORDER — HYDROXYZINE HCL 10 MG PO TABS: 10.0000 mg | ORAL_TABLET | Freq: Four times a day (QID) | ORAL | 2 refills | Status: DC | PRN

## 2016-07-08 NOTE — Progress Notes (Signed)
Subjective:  Patient ID: Tracy Blevins, female    DOB: 1976/01/24  Age: 41 y.o. MRN: [id number]  CC: Establish Care   [facility] Carlynn SpryQuintana presents for   Palpitations: Reports palpitations since last office visit. Last time she experienced was Sunday. Symptoms also included SOB. Denies any CP or swelling of the BLE. She is a non-smoker. Reports anxiety when having symptoms . Reports taking Hydroxyzine as needed to help with symptoms. Reports relief of symptoms with Hydroxyzine but symptoms of drowsiness and dizziness. Request lower medication dose.Reports adherence with daily aspirin. She report haivng echocardiogram done today. Thyroid panel from previous visit was normal. History of anemia. Recent ferritin level shows iron stores are still low and iron supplement was prescribed. She reports adherence with iron supplementation.   Outpatient Medications Prior to Visit  Medication Sig Dispense Refill  . ferrous sulfate 325 (65 FE) MG tablet Take 1 tablet (325 mg total) by mouth daily with breakfast. 60 tablet 3  . hydrOXYzine (ATARAX/VISTARIL) 25 MG tablet Take 1 tablet (25 mg total) by mouth every 4 (four) hours as needed for anxiety. 20 tablet 0   No facility-administered medications prior to visit.      ROS Review of Systems  Respiratory: Positive for shortness of breath (episode on Sunday).   Cardiovascular: Positive for palpitations (episode on Sunday).  Gastrointestinal: Negative.   Neurological: Negative.     Objective:  BP 119/76 (BP Location: Left Arm, Patient Position: Sitting, Cuff Size: Normal)   Pulse 71   Temp 98.2 F (36.8 C) (Oral)   Resp 18   Ht 5\' 7"  (1.702 m)   Wt 180 lb 6.4 oz (81.8 kg)   LMP 06/11/2016 (Approximate)   SpO2 99%   BMI 28.25 kg/m   BP/Weight 07/08/2016 06/25/2016 06/19/2016  Systolic BP 119 114 111  Diastolic BP 76 77 78  Wt. (Lbs) 180.4 180.2 -  BMI 28.25 28.22 24.28    Physical Exam  Constitutional: She is oriented to person,  place, and time.  Eyes: Pupils are equal, round, and reactive to light.  Neck: No JVD present.  Cardiovascular: Normal rate, regular rhythm, normal heart sounds and intact distal pulses.   Pulmonary/Chest: Effort normal and breath sounds normal. No respiratory distress.  Abdominal: Soft. Bowel sounds are normal.  Neurological: She is alert and oriented to person, place, and time.  Skin: Skin is warm and dry.  Nursing note and vitals reviewed.  Assessment & Plan:   Problem List Items Addressed This Visit    None    Visit Diagnoses    History of palpitations    -  Primary   -Patient refused EKG in office despite reporting recent history of palpitations/SOB .    -Echocardiogram was performed today. Still awaiting results.    Anxiety       Relevant Medications   hydrOXYzine (ATARAX/VISTARIL) 10 MG tablet   Low serum ferritin level       Relevant Orders   Ferritin      Meds ordered this encounter  Medications  . hydrOXYzine (ATARAX/VISTARIL) 10 MG tablet    Sig: Take 1 tablet (10 mg total) by mouth every 6 (six) hours as needed for anxiety.    Dispense:  30 tablet    Refill:  2    Order Specific Question:   Supervising Provider    Answer:   Quentin AngstJEGEDE, OLUGBEMIGA E [id number]    Follow-up: Return if symptoms worsen or fail to improve.   Enrico Eaddy R Amya Hlad  FNP    

## 2016-07-08 NOTE — Progress Notes (Signed)
Patient is here for palpitations since November 2017 it started from having it once every two weeks now is a everyday thing  Patient has taking her current meds   Patient has eaten today

## 2016-07-08 NOTE — Progress Notes (Signed)
  Echocardiogram 2D Echocardiogram has been performed.  Leta JunglingCooper, Alroy Portela M 07/08/2016, 10:51 AM

## 2016-07-08 NOTE — Patient Instructions (Addendum)
You will be called with the results of your echocardiogram. Schedule lab appointment in 3 months for iron storage level.   Palpitations A palpitation is the feeling that your heartbeat is irregular or is faster than normal. It may feel like your heart is fluttering or skipping a beat. Palpitations are usually not a serious problem. They may be caused by many things, including smoking, caffeine, alcohol, stress, and certain medicines. Although most causes of palpitations are not serious, palpitations can be a sign of a serious medical problem. In some cases, you may need further medical evaluation. Follow these instructions at home: Pay attention to any changes in your symptoms. Take these actions to help with your condition:  Avoid the following:  Caffeinated coffee, tea, soft drinks, diet pills, and energy drinks.  Chocolate.  Alcohol.  Do not use any tobacco products, such as cigarettes, chewing tobacco, and e-cigarettes. If you need help quitting, ask your health care provider.  Try to reduce your stress and anxiety. Things that can help you relax include:  Yoga.  Meditation.  Physical activity, such as swimming, jogging, or walking.  Biofeedback. This is a method that helps you learn to use your mind to control things in your body, such as your heartbeats.  Get plenty of rest and sleep.  Take over-the-counter and prescription medicines only as told by your health care provider.  Keep all follow-up visits as told by your health care provider. This is important. Contact a health care provider if:  You continue to have a fast or irregular heartbeat after 24 hours.  Your palpitations occur more often. Get help right away if:  You have chest pain or shortness of breath.  You have a severe headache.  You feel dizzy or you faint. This information is not intended to replace advice given to you by your health care provider. Make sure you discuss any questions you have with  your health care provider. Document Released: 04/03/2000 Document Revised: 09/09/2015 Document Reviewed: 12/20/2014 Elsevier Interactive Patient Education  2017 [employer]

## 2016-07-09 ENCOUNTER — Telehealth: Payer: Self-pay

## 2016-07-09 NOTE — Telephone Encounter (Signed)
CMA call to go over echocardiogram results  Patient Verify DOB    Patient was aware and understood

## 2016-07-09 NOTE — Telephone Encounter (Signed)
-----   Message from Anders SimmondsAngela M McClung, [place]PA-C sent at 07/09/2016  8:53 AM EDT ----- Call patient and let her know that her echocardiogram was essentially normal and to follow up as planned.   Thanks, Georgian CoAngela McClung, PA-C

## 2016-10-08 ENCOUNTER — Other Ambulatory Visit: Payer: Self-pay | Admitting: Family Medicine

## 2016-10-19 ENCOUNTER — Ambulatory Visit: Payer: Self-pay | Attending: Family Medicine | Admitting: Family Medicine

## 2016-10-19 VITALS — BP 98/62 | HR 70 | Temp 98.4°F | Resp 18 | Ht 66.0 in | Wt 178.6 lb

## 2016-10-19 DIAGNOSIS — N898 Other specified noninflammatory disorders of vagina: Secondary | ICD-10-CM | POA: Insufficient documentation

## 2016-10-19 DIAGNOSIS — N6029 Fibroadenosis of unspecified breast: Secondary | ICD-10-CM

## 2016-10-19 DIAGNOSIS — Z113 Encounter for screening for infections with a predominantly sexual mode of transmission: Secondary | ICD-10-CM

## 2016-10-19 DIAGNOSIS — Z862 Personal history of diseases of the blood and blood-forming organs and certain disorders involving the immune mechanism: Secondary | ICD-10-CM

## 2016-10-19 DIAGNOSIS — Z803 Family history of malignant neoplasm of breast: Secondary | ICD-10-CM | POA: Insufficient documentation

## 2016-10-19 DIAGNOSIS — K439 Ventral hernia without obstruction or gangrene: Secondary | ICD-10-CM

## 2016-10-19 DIAGNOSIS — R1032 Left lower quadrant pain: Secondary | ICD-10-CM

## 2016-10-19 DIAGNOSIS — Z01419 Encounter for gynecological examination (general) (routine) without abnormal findings: Secondary | ICD-10-CM

## 2016-10-19 LAB — POCT URINALYSIS DIPSTICK
Bilirubin, UA: NEGATIVE
Blood, UA: NEGATIVE
GLUCOSE UA: NEGATIVE
KETONES UA: NEGATIVE
Leukocytes, UA: NEGATIVE
Nitrite, UA: NEGATIVE
Protein, UA: NEGATIVE
SPEC GRAV UA: 1.02 (ref 1.010–1.025)
Urobilinogen, UA: 1 E.U./dL
pH, UA: 7 (ref 5.0–8.0)

## 2016-10-19 LAB — POCT URINE PREGNANCY: Preg Test, Ur: NEGATIVE

## 2016-10-19 NOTE — Patient Instructions (Signed)
Autoexamen de mamas (Breast Self-Awareness) El autoexamen de mamas significa lo siguiente:  Saber cul es el aspecto de las mamas.  Saber cmo se las siente al tacto.  Controlarse las mamas mensualmente para detectar cambios.  Informar el mdico si advierte un cambio en las mamas. El autoexamen de mama le permite advertir problemas en la mama con anticipacin, cuando todava son pequeos. CMO REALIZAR EL AUTOEXAMEN DE MAMAS Una forma de aprender qu es normal para las mamas y revisar si hay cambios es hacerse un autoexamen de las mamas. Para hacer un autoexamen de las mamas: Busque cambios 1. Qutese toda la ropa por encima de la cintura. 2. Prese frente a un espejo en una habitacin con buena iluminacin. 3. Apoye las manos en las caderas. 4. Empuje hacia abajo con las manos. 5. Mrese las mamas y los pezones en el espejo, para ver si hay diferencias entre s. Durante el examen, intente determinar si:  La forma de una mama es diferente.  El tamao de una mama es diferente.  Hay arrugas, depresiones y protuberancias en una mama y no en la otra. 1. Observe cada mama para buscar cambios en la piel, por ejemplo:  Enrojecimiento.  Zonas escamosas. 1. Observe si hay cambios en los pezones, por ejemplo:  Lquido alrededor de los pezones.  Hemorragia.  Hoyuelos.  Enrojecimiento.  Un cambio en el lugar de los pezones. Plpese para detectar si hay cambios 1. Acustese en el piso boca arriba. 2. Plpese cada mama. Para hacerlo, siga estos pasos:  Elija una mama para palpar.  Coloque el brazo ms cercano a esa mama por encima de la cabeza.  Use el otro brazo para palpar la zona del pezn de la mama. Plpese la zona con las yemas de los tres dedos del medio y haga crculos con los dedos. Con el primer crculo, presione suavemente. Con el segundo, ms fuerte. Con el tercero, an ms fuerte.  Siga haciendo crculos con los dedos con la presin suave, fuerte e incluso ms  fuerte a medida que desciende por la mama. Detngase cuando sienta las costillas.  Desplace los dedos un poco hacia el centro del cuerpo.  Empiece a hacer crculos con los dedos nuevamente y esta vez haga movimientos ascendentes hasta llegar a la clavcula.  Siga haciendo crculos hacia arriba y hacia abajo hasta llegar a la axila. Recuerde hacerlos con las tres presiones.  Plpese la otra mama de la misma forma. 1. Sintese o prese en la ducha o la baera. 2. Con agua jabonosa en la piel, plpese cada mama del mismo modo que lo hizo en el paso2, mientras estaba acostada en el piso. Anote sus hallazgos Despus del autoexamen, anote lo siguiente:  Qu es normal para cada mama.  Cualquier cambio que haya encontrado en cada mama.  Cundo tuvo la ltima menstruacin. CON QU FRECUENCIA DEBO EXAMINARME LAS MAMAS? Examnese las mamas todos los meses. Si est amamantando, el mejor momento para el examen es despus de darle de mamar al beb o despus de usar un sacaleches. Si menstra, el mejor momento para hacerlo es 5 a 7das despus de la finalizada la menstruacin. CUNDO DEBO VISITAR AL MDICO? Visite al mdico si advierte lo siguiente:  Un cambio en la forma o el tamao de las mamas o los pezones.  Un cambio en la piel de las mamas o los pezones, como la piel enrojecida o escamosa.  Secrecin de un lquido fuera de lo comn de los pezones.  Un ndulo o una   zona engrosada que no tena antes.  Dolor de mamas.  Cualquier cosa que la preocupe. Esta informacin no tiene como fin reemplazar el consejo del mdico. Asegrese de hacerle al mdico cualquier pregunta que tenga. Document Released: 05/09/2010 Document Revised: 07/29/2015 Document Reviewed: 02/24/2015 Elsevier Interactive Patient Education  2018 Elsevier Inc.  Prueba de Papanicolaou (Pap Test) POR QU ME DEBO REALIZAR ESTA PRUEBA? A esta prueba tambin se la denomina "frotis de Pap". Es una prueba de deteccin que se  utiliza para detectar signos de cncer de vagina, cuello del tero y tero. La prueba tambin puede identificar la presencia de infeccin o cambios precancerosos. El mdico probablemente le recomiende que se realice esta prueba en forma regular. Esta prueba puede realizarse de la siguiente manera:  Cada 3 aos, a partir de los 21 aos.  Cada 5 aos, en combinacin con las pruebas que se realizan para detectar la presencia del virus del papiloma humano (VPH).  Con mayor o menor frecuencia, en funcin de otras enfermedades que tenga. QU TIPO DE MUESTRA SE TOMA? El mdico utilizar un pequeo hisopo de algodn, una esptula de plstico o un cepillo para recolectar una muestra de clulas de la superficie del cuello del tero. El cuello del tero es la apertura del tero, que tambin se conoce como matriz. Tambin pueden recolectarse las secreciones del cuello del tero y la vagina. CMO DEBO PREPARARME PARA ESTA PRUEBA?  Tenga en cuenta en qu etapa del ciclo menstrual se encuentra. Es posible que deba reprogramar la prueba si est menstruando el da en que debe realizrsela.  Si el da en que debe realizarse la prueba tiene una infeccin vaginal aparente, deber reprogramar la prueba.  Pueden pedirle que evite tomar una ducha o bao el da de la prueba o el da anterior.  Algunos medicamentos pueden provocar resultados anormales de la prueba, como los digitlicos y la tetraciclina. Si toma alguno de estos medicamentos, hable con su mdico antes de realizarse la prueba. QU SIGNIFICAN LOS RESULTADOS? Los resultados anormales de la prueba pueden indicar diversas enfermedades. Estas pueden incluir lo siguiente:  Cncer. Si bien los resultados de la prueba de Papanicolaou no pueden utilizarse para diagnosticar cncer de cuello del tero, de vagina o de tero, pueden indicar que existe una posibilidad de presencia de cncer. En este caso, ser necesario realizar pruebas adicionales para determinar  la presencia de cncer.  Enfermedad de transmisin sexual.  Infecciones por hongos.  Infeccin por parsitos.  Infeccin por herpes.  Una enfermedad que causa o favorece la infertilidad. Es su responsabilidad retirar el resultado del estudio. Consulte en el laboratorio o en el departamento en el que fue realizado el estudio cundo y cmo podr obtener los resultados. Comunquese con el mdico si tiene preguntas sobre los resultados. Esta informacin no tiene como fin reemplazar el consejo del mdico. Asegrese de hacerle al mdico cualquier pregunta que tenga. Document Released: 09/23/2007 Document Revised: 04/27/2014 Document Reviewed: 08/28/2013 Elsevier Interactive Patient Education  2018 Elsevier Inc.  

## 2016-10-19 NOTE — Progress Notes (Signed)
Subjective:  Patient ID: Tracy Blevins, female    DOB: 15-Apr-1976  Age: 41 y.o. MRN: [id number]  CC: Gynecologic Exam   HPI Tracy Blevins presents for well woman visit with gynecological examination. She reports any family history of breast cancer-mother, maternal grandmother, and maternal aunt. She denies family history of gynecological cancers. She denies any lumps, nipple discharge, denting or dimpling of the breast. She does not perform monthly SBE. She does reports history of breast cysts in the past and US performed. and She is a non-smoker. She requests STI testing with annual physical examination. Reports 1 partner within the last 3 months. She reports any small amount of vaginal discharge, light yellow, non-odorous. She denies any dysuria or lesions. She complaints of LLQ pain that is worse with movement. History of c-section  9 years ago. Reports burning at the incision site that is intermittent and is rated 6 to 7 out of 10. Pain relief includes inflammatories, herbal teas.   Outpatient Medications Prior to Visit  Medication Sig Dispense Refill  . ferrous sulfate 325 (65 FE) MG tablet Take 1 tablet (325 mg total) by mouth daily with breakfast. 60 tablet 3  . hydrOXYzine (ATARAX/VISTARIL) 10 MG tablet Take 1 tablet (10 mg total) by mouth every 6 (six) hours as needed for anxiety. 30 tablet 2   No facility-administered medications prior to visit.     ROS Review of Systems  Constitutional: Negative.   Respiratory: Negative.   Cardiovascular: Negative.   Gastrointestinal: Positive for abdominal pain.  Genitourinary: Negative.   Skin: Negative.   Psychiatric/Behavioral: Negative.      Objective:  BP 98/62 (BP Location: Left Arm, Patient Position: Sitting, Cuff Size: Normal)   Pulse 70   Temp 98.4 F (36.9 C) (Oral)   Resp 18   Ht 5\' 6"  (1.676 m)   Wt 178 lb 9.6 oz (81 kg)   SpO2 99%   BMI 28.83 kg/m   BP/Weight 10/19/2016 07/08/2016 06/25/2016  Systolic BP 98 119  114  Diastolic BP 62 76 77  Wt. (Lbs) 178.6 180.4 180.2  BMI 28.83 28.25 28.22     Physical Exam  Constitutional: She appears well-developed and well-nourished.  Eyes: Conjunctivae are normal. Pupils are equal, round, and reactive to light.  Neck: No JVD present.  Cardiovascular: Normal rate, regular rhythm, normal heart sounds and intact distal pulses.   Pulmonary/Chest: Effort normal and breath sounds normal. Right breast exhibits no nipple discharge, no skin change and no tenderness. Left breast exhibits no nipple discharge, no skin change and no tenderness.  Lumpy breasts  Abdominal: Soft. Bowel sounds are normal. There is tenderness (LLQ).  Genitourinary: Vagina normal. Cervix exhibits no discharge.  Skin: Skin is warm and dry.  Nursing note and vitals reviewed.  Assessment & Plan:   Problem List Items Addressed This Visit    None    Visit Diagnoses    Well woman exam with routine gynecological exam    -  Primary   Relevant Orders   Cytology - PAP [place]   Left lower quadrant pain       Relevant Orders   Urinalysis Dipstick (Completed)   POCT urine pregnancy (Completed)   Screening for STDs (sexually transmitted diseases)       Relevant Orders   Cytology - PAP [place]   HEP, RPR, HIV Panel (Completed)   HSV(herpes simplex vrs) 1+2 ab-IgG (Completed)   Segmental fibroadenosis of breast, unspecified laterality  Relevant Orders   US BREAST COMPLETE UNI LEFT INC AXILLA   US BREAST COMPLETE UNI RIGHT INC AXILLA   Hernia of abdominal wall       Relevant Orders   Ambulatory referral to General Surgery   History of anemia       Relevant Orders   Ferritin (Completed)      No orders of the defined types were placed in this encounter.   Follow-up: Return As needed.   Lizbeth Bark FNP

## 2016-10-19 NOTE — Progress Notes (Signed)
Patient is here for PAP  Patient needs referral to MM  Patient complains about left lower abdomen pain

## 2016-10-20 LAB — CERVICOVAGINAL ANCILLARY ONLY
BACTERIAL VAGINITIS: NEGATIVE
CANDIDA VAGINITIS: NEGATIVE
CHLAMYDIA, DNA PROBE: NEGATIVE
NEISSERIA GONORRHEA: NEGATIVE
TRICH (WINDOWPATH): NEGATIVE

## 2016-10-20 LAB — HEP, RPR, HIV PANEL
HIV Screen 4th Generation wRfx: NONREACTIVE
Hepatitis B Surface Ag: NEGATIVE
RPR Ser Ql: NONREACTIVE

## 2016-10-20 LAB — HSV(HERPES SIMPLEX VRS) I + II AB-IGG
HSV 1 GLYCOPROTEIN G AB, IGG: 30.2 {index} — AB (ref 0.00–0.90)
HSV 2 Glycoprotein G Ab, IgG: <0.91 index (ref 0.00–0.90)

## 2016-10-20 LAB — FERRITIN: Ferritin: 21 ng/mL (ref 15–150)

## 2016-10-23 ENCOUNTER — Telehealth: Payer: Self-pay

## 2016-10-23 ENCOUNTER — Other Ambulatory Visit: Payer: Self-pay | Admitting: Family Medicine

## 2016-10-23 DIAGNOSIS — R8789 Other abnormal findings in specimens from female genital organs: Secondary | ICD-10-CM

## 2016-10-23 DIAGNOSIS — R87618 Other abnormal cytological findings on specimens from cervix uteri: Secondary | ICD-10-CM

## 2016-10-23 LAB — CYTOLOGY - PAP
Adequacy: ABSENT
Diagnosis: NEGATIVE
HPV: DETECTED — AB

## 2016-10-23 LAB — CERVICOVAGINAL ANCILLARY ONLY: HERPES (WINDOWPATH): NEGATIVE

## 2016-10-23 NOTE — Telephone Encounter (Signed)
CMA call regarding lab results   Patient Verify DOB   Patient was aware and understood  

## 2016-10-23 NOTE — Telephone Encounter (Signed)
-----   Message from Lizbeth BarkMandesia R Hairston, FNP sent at 10/23/2016 12:28 PM EDT ----- -HIV, Hepatitis B, and Syphilis  are all negative. -Gonorrhea, Chlamydia, BV, Yeast, and Trichomonas were all negative. Herpes type 1 which is primarily responsible for cold sores is positive. When you have cold sores do not kiss anyone, share utensils, or have oral sex. -Herpes type 2 which is primarily responsible for genital herpes is negative. -Iron levels have improved.  -Still awaiting PAP results.

## 2016-10-23 NOTE — Telephone Encounter (Signed)
-----   Message from Lizbeth BarkMandesia R Hairston, FNP sent at 10/23/2016  2:35 PM EDT ----- Pap detected HPV. You will be referred to gynecology.

## 2016-10-30 ENCOUNTER — Ambulatory Visit: Payer: Self-pay | Attending: Family Medicine

## 2016-11-09 ENCOUNTER — Other Ambulatory Visit (HOSPITAL_COMMUNITY): Payer: Self-pay | Admitting: *Deleted

## 2016-11-09 DIAGNOSIS — N63 Unspecified lump in unspecified breast: Secondary | ICD-10-CM

## 2016-11-09 DIAGNOSIS — N644 Mastodynia: Secondary | ICD-10-CM

## 2016-11-12 ENCOUNTER — Encounter: Payer: Self-pay | Admitting: Surgery

## 2016-11-12 ENCOUNTER — Ambulatory Visit (INDEPENDENT_AMBULATORY_CARE_PROVIDER_SITE_OTHER): Payer: Self-pay | Admitting: Surgery

## 2016-11-12 VITALS — BP 127/82 | HR 85 | Temp 98.1°F | Wt 175.0 lb

## 2016-11-12 DIAGNOSIS — R1032 Left lower quadrant pain: Secondary | ICD-10-CM

## 2016-11-12 NOTE — Progress Notes (Signed)
Surgical Consultation  11/12/2016  Tracy Blevins is an 41 y.o. female.   Chief Complaint  Patient presents with  . New Patient (Initial Visit)    hernia     HPI: Tracy Blevins is a 41 year old female seen in consultation at the request of Tracy Blevins. She reports some intermittent left inguinal and lower quadrant pain that is burning in nature. Mild to moderate in severity. She reports that the pain would worsen with certain movement and when lifting. Of note she does have a history of a C-section 9 years ago and states that her pain started shortly after her surgery. No evidence of bulging. I have personally reviewed her CT scan from 2012 and there is no evidence of hernias or any acute into abdominal abnormalities. She had a significant history of appendectomy and tubal ligation in the past He denies any fevers, chills, shortness of breath or weight loss.Recent labs reviewed including a normal TSH, normal urinalysis. Nmlr creatinine and hemoglobin.   Past Medical History:  Diagnosis Date  . Headache   . S/P appy   . S/P tubal ligation     Past Surgical History:  Procedure Laterality Date  . APPENDECTOMY  2007  . CESAREAN SECTION  2000  . CESAREAN SECTION  2008  . TUBAL LIGATION  2008    Family History  Problem Relation Age of Onset  . Hypertension Mother   . Diabetes Mother   . Hypertension Father   . Uterine cancer Maternal Grandmother   . Uterine cancer Other     Social History:  reports that she has never smoked. She has never used smokeless tobacco. She reports that she does not drink alcohol or use drugs.  Allergies: No Known Allergies  Medications reviewed.     ROS Full ROS performed and is otherwise negative other than what is stated in the HPI    BP 127/82   Pulse 85   Temp 98.1 F (36.7 C) (Oral)   Wt 79.4 kg (175 lb)   LMP 10/30/2016   BMI 28.25 kg/m   Physical Exam  Constitutional: She is oriented to person, place, and time and  well-developed, well-nourished, and in no distress. No distress.  Eyes: Right eye exhibits no discharge. Left eye exhibits no discharge. No scleral icterus.  Neck: Normal range of motion. Neck supple. No JVD present. No tracheal deviation present. No thyromegaly present.  Cardiovascular: Normal rate, regular rhythm and normal heart sounds.   Pulmonary/Chest: Effort normal and breath sounds normal. No respiratory distress. She has no wheezes.  Abdominal: Soft. She exhibits no distension and no mass. There is no tenderness. There is no rebound and no guarding.  Pfannenstiel scar, no evidence of hernias or masses  Musculoskeletal: Normal range of motion. She exhibits no edema.  Lymphadenopathy:    She has no cervical adenopathy.  Neurological: She is alert and oriented to person, place, and time. Gait normal. GCS score is 15.  Skin: Skin is warm and dry. She is not diaphoretic.  Psychiatric: Mood, memory, affect and judgment normal.  Nursing note and vitals reviewed.  Assessment/Plan: Left intermittent inguinal pain no evidence of inguinal hernia or incisional hernia on physical exam. May be related to a neuroma from previous C-section. Discussed with the patient in detail options of medical management versus observation. At this time the symptoms are not severe enough to warrant treatment with medication. She wishes to observe her condition for now. No need for any surgical intervention. We will forward a  copy of this report to Tracy Blevins  Tracy Pabon, MD [facility] General Surgeon

## 2016-11-12 NOTE — Patient Instructions (Signed)
Si tiene preguntas nos puede llamar.

## 2016-11-19 ENCOUNTER — Ambulatory Visit
Admission: RE | Admit: 2016-11-19 | Discharge: 2016-11-19 | Disposition: A | Discharge status: Home / Self Care | Payer: Self-pay | Source: Ambulatory Visit | Attending: Obstetrics and Gynecology | Admitting: Obstetrics and Gynecology

## 2016-11-19 ENCOUNTER — Encounter (HOSPITAL_COMMUNITY): Payer: Self-pay

## 2016-11-19 ENCOUNTER — Ambulatory Visit: Payer: Self-pay

## 2016-11-19 ENCOUNTER — Ambulatory Visit (HOSPITAL_COMMUNITY)
Admission: RE | Admit: 2016-11-19 | Discharge: 2016-11-19 | Disposition: A | Discharge status: Home / Self Care | Payer: Self-pay | Source: Ambulatory Visit | Attending: Obstetrics and Gynecology | Admitting: Obstetrics and Gynecology

## 2016-11-19 VITALS — BP 131/79 | HR 73 | Ht 65.0 in | Wt 178.2 lb

## 2016-11-19 DIAGNOSIS — N63 Unspecified lump in unspecified breast: Secondary | ICD-10-CM

## 2016-11-19 DIAGNOSIS — N644 Mastodynia: Secondary | ICD-10-CM

## 2016-11-19 DIAGNOSIS — Z1239 Encounter for other screening for malignant neoplasm of breast: Secondary | ICD-10-CM

## 2016-11-19 NOTE — Progress Notes (Signed)
Complaints of bilateral breast pain that comes and goes that is worse within right breast. Patient rates the pain at a 6-7 out of 10.  Pap Smear: Pap smear not completed today. [facility] and normal with positive HPV. Per patient has no history of an abnormal Pap smear. Last Pap smear result is in EPIC.  Physical exam: Breasts Breasts symmetrical. No skin abnormalities bilateral breasts. No nipple retraction bilateral breasts. No nipple discharge bilateral breasts. No lymphadenopathy. No lumps palpated bilateral breasts. Complaints of right outer and left breast at 9 o'clock tenderness on exam. [facility]. Appointment scheduled for Thursday, November 19, 2016 at 1450.        Pelvic/Bimanual No Pap smear completed today since last Pap smear was 10/19/2016. Pap smear not indicated per BCCCP guidelines.   Smoking History: Patient has never smoked.  Patient Navigation: Patient education provided. [facility] services [facility]. Spanish interpreter provided.  Used Spanish interpreter [employer]athy Hinshaw from [employer].

## 2016-11-19 NOTE — Patient Instructions (Signed)
Explained breast self awareness with [facility]. Patient did not need a Pap smear today due to last Pap smear was 10/19/2016. Let patient know that her next Pap smear is due in one year due to her most recent Pap smear being HPV positive. Informed patient that she can have completed in BCCCP clinic. [facility]. Appointment scheduled for Thursday, November 19, 2016 at 1450. [name] verbalized understanding.  [place], [name], RN 3:09 PM

## 2017-06-10 ENCOUNTER — Ambulatory Visit: Payer: Self-pay | Attending: Family Medicine | Admitting: Physician Assistant

## 2017-06-10 ENCOUNTER — Ambulatory Visit (HOSPITAL_COMMUNITY)
Admission: RE | Admit: 2017-06-10 | Discharge: 2017-06-10 | Disposition: A | Discharge status: Home / Self Care | Payer: Self-pay | Source: Ambulatory Visit | Attending: Physician Assistant | Admitting: Physician Assistant

## 2017-06-10 VITALS — BP 115/78 | HR 79 | Temp 98.4°F | Resp 16 | Ht 66.0 in | Wt 178.2 lb

## 2017-06-10 DIAGNOSIS — M545 Low back pain, unspecified: Secondary | ICD-10-CM

## 2017-06-10 DIAGNOSIS — Z3202 Encounter for pregnancy test, result negative: Secondary | ICD-10-CM | POA: Insufficient documentation

## 2017-06-10 DIAGNOSIS — R238 Other skin changes: Secondary | ICD-10-CM | POA: Insufficient documentation

## 2017-06-10 DIAGNOSIS — Z9851 Tubal ligation status: Secondary | ICD-10-CM | POA: Insufficient documentation

## 2017-06-10 DIAGNOSIS — N912 Amenorrhea, unspecified: Secondary | ICD-10-CM | POA: Insufficient documentation

## 2017-06-10 DIAGNOSIS — R350 Frequency of micturition: Secondary | ICD-10-CM

## 2017-06-10 DIAGNOSIS — N390 Urinary tract infection, site not specified: Secondary | ICD-10-CM | POA: Insufficient documentation

## 2017-06-10 DIAGNOSIS — L853 Xerosis cutis: Secondary | ICD-10-CM

## 2017-06-10 LAB — POCT URINALYSIS DIPSTICK
BILIRUBIN UA: NEGATIVE
Blood, UA: NEGATIVE
Glucose, UA: NEGATIVE
Ketones, UA: NEGATIVE
Nitrite, UA: NEGATIVE
PH UA: 5.5 (ref 5.0–8.0)
Protein, UA: NEGATIVE
Spec Grav, UA: 1.02 (ref 1.010–1.025)
UROBILINOGEN UA: 0.2 U/dL

## 2017-06-10 LAB — POCT URINE PREGNANCY: Preg Test, Ur: NEGATIVE

## 2017-06-10 MED ORDER — NITROFURANTOIN MONOHYD MACRO 100 MG PO CAPS: 100.0000 mg | ORAL_CAPSULE | Freq: Two times a day (BID) | ORAL | 0 refills | Status: DC

## 2017-06-10 MED ORDER — TRIAMCINOLONE ACETONIDE 0.1 % EX CREA: 1.0000 "application " | TOPICAL_CREAM | Freq: Two times a day (BID) | CUTANEOUS | 0 refills | Status: DC

## 2017-06-10 MED ORDER — IBUPROFEN 600 MG PO TABS: 600.0000 mg | ORAL_TABLET | Freq: Three times a day (TID) | ORAL | 0 refills | Status: DC | PRN

## 2017-06-10 MED FILL — TRIAMCINOLONE ACETONIDE 0.1: 0.1 | 15 days supply | Qty: 30 | Fill #0

## 2017-06-10 MED FILL — ?NITROFURANTOIN-MACRO 100 M: 100 | 3 days supply | Qty: 6 | Fill #0

## 2017-06-10 MED FILL — IBUPROFEN 600 MG TABLET: 600 | 10 days supply | Qty: 30 | Fill #0

## 2017-06-10 NOTE — Progress Notes (Signed)
Patient is here for dry skin on her leg and back.  Patient stated back pain that is more like down on her spine.   Patient stated she been using the bathroom a lot since Sunday and no pain when she urinates.   Irritation and itching on vaginal yesterday but better today.

## 2017-06-10 NOTE — Progress Notes (Signed)
Patient ID: [name], female   DOB: April 03, 1976, 42 y.o.   MRN: [id number] "Tracy CrapeClaudia" on [employer] translating.    2 day h/o urinary frequency,  Dry skin for 1 month.  Pain in lower back for about 3 weeks.  No OTC.

## 2017-06-10 NOTE — Progress Notes (Signed)
Patient ID: Tracy Blevins, female   DOB: Aug 06, 1975, 42 y.o.   MRN: [id number]      Tracy Averlma PAZ Blevins, is a 42 y.o. female  [id number]  [id number]  DOB - Aug 06, 1975  Subjective:  Chief Complaint and HPI: Tracy Blevins is a 42 y.o. female here today Claudia with [employer] interpreters  2-3 day h/o urinary frequency.  No vaginal d/c.  No pelvic pain.  +dry skin all winter.  Mid-line LBP X 3 weeks.  NKI.  No paresthesias or radicular s/sx.  Sometimes made worse or better by BM.  No N/V/D.    ROS:   Constitutional:  No f/c, No night sweats, No unexplained weight loss. EENT:  No vision changes, No blurry vision, No hearing changes. No mouth, throat, or ear problems.  Respiratory: No cough, No SOB Cardiac: No CP, no palpitations GI:  No abd pain, No N/V/D. GU: Urinary s/sx Musculoskeletal: +LBP Neuro: No headache, no dizziness, no motor weakness.  Skin: No rash Endocrine:  No polydipsia. No polyuria.  Psych: Denies SI/HI  No problems updated.  ALLERGIES: No Known Allergies  PAST MEDICAL HISTORY: Past Medical History:  Diagnosis Date  . Headache   . S/P appy   . S/P tubal ligation     MEDICATIONS AT HOME: Prior to Admission medications   Medication Sig Start Date End Date Taking? Authorizing Provider  ibuprofen (ADVIL,MOTRIN) 600 MG tablet Take 1 tablet (600 mg total) by mouth every 8 (eight) hours as needed. 06/10/17   Anders SimmondsMcClung, Sunday Klos M, PA-C  nitrofurantoin, macrocrystal-monohydrate, (MACROBID) 100 MG capsule Take 1 capsule (100 mg total) by mouth 2 (two) times daily. 06/10/17   Anders SimmondsMcClung, Auria Mckinlay M, PA-C  triamcinolone cream (KENALOG) 0.1 % Apply 1 application topically 2 (two) times daily. 06/10/17   Anders SimmondsMcClung, Maisen Klingler M, PA-C     Objective:  EXAM:   Vitals:   06/10/17 1059  BP: 115/78  Pulse: 79  Resp: 16  Temp: 98.4 F (36.9 C)  TempSrc: Oral  SpO2: 98%  Weight: 178 lb 3.2 oz (80.8 kg)  Height: 5\' 6"  (1.676 m)    General appearance : A&OX3.  NAD. Non-toxic-appearing HEENT: Atraumatic and Normocephalic.  PERRLA. EOM intact.  TM clear B. Mouth-MMM, post pharynx WNL w/o erythema, No PND. Neck: supple, no JVD. No cervical lymphadenopathy. No thyromegaly Chest/Lungs:  Breathing-non-labored, Good air entry bilaterally, breath sounds normal without rales, rhonchi, or wheezing  CVS: S1 S2 regular, no murmurs, gallops, rubs  Abdomen: Bowel sounds present, Non tender and not distended with no gaurding, rigidity or rebound. Low back-full S&ROM.  DTR =B.  +paraspinus spasm B.   Extremities: Bilateral Lower Ext shows no edema, both legs are warm to touch with = pulse throughout Neurology:  CN II-XII grossly intact, Non focal.   Psych:  TP linear. J/I WNL. Normal speech. Appropriate eye contact and affect.  Skin:  No Rash  Data Review No results found for: HGBA1C   Assessment & Plan   1. Urinary frequency With pyuria - Urinalysis Dipstick - Urine Culture - nitrofurantoin, macrocrystal-monohydrate, (MACROBID) 100 MG capsule; Take 1 capsule (100 mg total) by mouth 2 (two) times daily.  Dispense: 6 capsule; Refill: 0  2. Amenorrhea - POCT urine pregnancy-negative. BTL  3. Acute midline low back pain without sciatica - DG Lumbar Spine Complete; Future - ibuprofen (ADVIL,MOTRIN) 600 MG tablet; Take 1 tablet (600 mg total) by mouth every 8 (eight) hours as needed.  Dispense: 30 tablet; Refill: 0  4. Dry skin triamcinilone  to mix with regular lotion     Patient have been counseled extensively about nutrition and exercise  Return if symptoms worsen or fail to improve, for assign new PCP.  The patient was given clear instructions to go to ER or return to medical center if symptoms don't improve, worsen or new problems develop. The patient verbalized understanding. The patient was told to call to get lab results if they haven't heard anything in the next week.     Georgian Co, PA-C [facility]  [place], [place] [phone]   06/10/2017, 11:43 AM

## 2017-06-12 LAB — URINE CULTURE: ORGANISM ID, BACTERIA: NO GROWTH

## 2017-06-15 ENCOUNTER — Other Ambulatory Visit: Payer: Self-pay | Admitting: Family Medicine

## 2017-06-15 ENCOUNTER — Telehealth: Payer: Self-pay

## 2017-06-15 NOTE — Progress Notes (Signed)
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 Referred to gynecology who recommended repeat screening with HPV DNA testing in 1 year. She will need repeat screening in one year.

## 2017-06-15 NOTE — Telephone Encounter (Signed)
-----   Message from Anders SimmondsAngela M McClung, [place]-C sent at 06/11/2017 10:41 AM EST ----- Please call patient.  Her back xrays do not show any abnormalities.  Her lower back pain may just be strained muscles or from the bladder infection she has. Thanks, Georgian CoAngela McClung, PA-C

## 2017-06-15 NOTE — Telephone Encounter (Signed)
CMA spoke to patient to inform on xray results.  Patient understood.  Spanish Interpreter Vicente SereneGabriel [phone] assist with the call.

## 2017-06-21 ENCOUNTER — Telehealth: Payer: Self-pay

## 2017-06-21 NOTE — Telephone Encounter (Signed)
[employer] [place] ID# [id number] contacted pt to go over lab results left a detailed vm regarding results and if she has any questions or concerns to give me a call    If pt calls back please give results: Urine culture was negative. Follow up as needed.

## 2017-07-21 ENCOUNTER — Ambulatory Visit: Payer: Self-pay | Attending: Family Medicine

## 2017-07-22 ENCOUNTER — Ambulatory Visit: Payer: Self-pay | Attending: Internal Medicine | Admitting: Physician Assistant

## 2017-07-22 VITALS — BP 124/78 | HR 74 | Temp 98.8°F | Resp 16 | Ht 66.0 in | Wt 182.2 lb

## 2017-07-22 DIAGNOSIS — M549 Dorsalgia, unspecified: Secondary | ICD-10-CM | POA: Insufficient documentation

## 2017-07-22 DIAGNOSIS — M545 Low back pain, unspecified: Secondary | ICD-10-CM

## 2017-07-22 DIAGNOSIS — Z789 Other specified health status: Secondary | ICD-10-CM

## 2017-07-22 DIAGNOSIS — R194 Change in bowel habit: Secondary | ICD-10-CM | POA: Insufficient documentation

## 2017-07-22 DIAGNOSIS — R198 Other specified symptoms and signs involving the digestive system and abdomen: Secondary | ICD-10-CM

## 2017-07-22 NOTE — Progress Notes (Signed)
Pt. Is here for a back like pain. Patient think the pain is in her colon, the pain makes her want to use the bathroom.  Pt started 2 weeks ago.

## 2017-07-22 NOTE — Progress Notes (Signed)
Patient ID: Tracy Blevins, female   DOB: 09/15/1975, 42 y.o.   MRN: [id number]    Tracy Blevins, is a 42 y.o. female  [id number]  [id number]  DOB - 09/15/1975  Subjective:  Chief Complaint and HPI: Tracy Blevins is a 42 y.o. female here today for continued problems with what she refers to as "pain in her colon."  See last OV.  The pain is in the area of her back and when it occurs, it makes her feel as though she needs to have a BM.  She has also noticed a change in her BMs over the last couple of months from being constipated all the time to no longer having constipation.  No abdominal or pelvic pain.  UTD on pap.  Periods normal and occur at the end of each month.  Pain is mild to moderate and intermittently occurs.  No melena.  No hematochezia.  No exacerbating/ alleviating factors.  No change in appetite.    No FH colon CA.    ROS:   Constitutional:  No f/c, No night sweats, No unexplained weight loss. EENT:  No vision changes, No blurry vision, No hearing changes. No mouth, throat, or ear problems.  Respiratory: No cough, No SOB Cardiac: No CP, no palpitations GI:  No abd pain, No N/V/D. GU: No Urinary s/sx Musculoskeletal: +back pain Neuro: No headache, no dizziness, no motor weakness.  Skin: No rash Endocrine:  No polydipsia. No polyuria.  Psych: Denies SI/HI  No problems updated.  ALLERGIES: No Known Allergies  PAST MEDICAL HISTORY: Past Medical History:  Diagnosis Date  . Headache   . S/P appy   . S/P tubal ligation     MEDICATIONS AT HOME: Prior to Admission medications   Medication Sig Start Date End Date Taking? Authorizing Provider  ibuprofen (ADVIL,MOTRIN) 600 MG tablet Take 1 tablet (600 mg total) by mouth every 8 (eight) hours as needed. Patient not taking: Reported on 07/22/2017 06/10/17   Anders SimmondsMcClung, Angela M, PA-C  nitrofurantoin, macrocrystal-monohydrate, (MACROBID) 100 MG capsule Take 1 capsule (100 mg total) by mouth 2 (two) times  daily. Patient not taking: Reported on 07/22/2017 06/10/17   Anders SimmondsMcClung, Angela M, PA-C  triamcinolone cream (KENALOG) 0.1 % Apply 1 application topically 2 (two) times daily. Patient not taking: Reported on 07/22/2017 06/10/17   Anders SimmondsMcClung, Angela M, PA-C     Objective:  EXAM:   Vitals:   07/22/17 1448  BP: 124/78  Pulse: 74  Resp: 16  Temp: 98.8 F (37.1 C)  TempSrc: Oral  SpO2: 97%  Weight: 182 lb 3.2 oz (82.6 kg)  Height: 5\' 6"  (1.676 m)    General appearance : A&OX3. NAD. Non-toxic-appearing HEENT: Atraumatic and Normocephalic.  PERRLA. EOM intact.  TM clear B. Mouth-MMM, post pharynx WNL w/o erythema, No PND. Neck: supple, no JVD. No cervical lymphadenopathy. No thyromegaly Chest/Lungs:  Breathing-non-labored, Good air entry bilaterally, breath sounds normal without rales, rhonchi, or wheezing  CVS: S1 S2 regular, no murmurs, gallops, rubs  Abdomen: Bowel sounds present, Non tender and not distended with no gaurding, rigidity or rebound. Back:  Full S&ROM.  Non-tender, nos muscle spasms noted.  Neg SLR B.  DTR=intact B.   Extremities: Bilateral Lower Ext shows no edema, both legs are warm to touch with = pulse throughout Neurology:  CN II-XII grossly intact, Non focal.   Psych:  TP linear. J/I WNL. Normal speech. Appropriate eye contact and affect.  Skin:  No Rash  Data Review No results  found for: HGBA1C   Assessment & Plan   1. Change in bowel function - Comprehensive metabolic panel - CBC with Differential/Platelet - Ambulatory referral to Gastroenterology  2. Language barrier-friend is with her translating- additional time performing visit was required.  3. Back pain-xrays normal.  No red flags-advil or tylenol as needed.   Patient have been counseled extensively about nutrition and exercise  Return if symptoms worsen or fail to improve.  The patient was given clear instructions to go to ER or return to medical center if symptoms don't improve, worsen or new  problems develop. The patient verbalized understanding. The patient was told to call to get lab results if they haven't heard anything in the next week.     Georgian Co, PA-C [facility] [place], [place] [phone]   07/22/2017, 3:02 PM

## 2017-07-23 ENCOUNTER — Encounter: Payer: Self-pay | Admitting: Internal Medicine

## 2017-07-23 LAB — COMPREHENSIVE METABOLIC PANEL
ALBUMIN: 4.2 g/dL (ref 3.5–5.5)
ALT: 14 IU/L (ref 0–32)
AST: 14 IU/L (ref 0–40)
Albumin/Globulin Ratio: 1.8 (ref 1.2–2.2)
Alkaline Phosphatase: 48 IU/L (ref 39–117)
BUN / CREAT RATIO: 17 (ref 9–23)
BUN: 14 mg/dL (ref 6–24)
Bilirubin Total: 0.3 mg/dL (ref 0.0–1.2)
CALCIUM: 8.7 mg/dL (ref 8.7–10.2)
CO2: 23 mmol/L (ref 20–29)
Chloride: 104 mmol/L (ref 96–106)
Creatinine, Ser: 0.82 mg/dL (ref 0.57–1.00)
GFR calc Af Amer: 104 mL/min/{1.73_m2} (ref >59)
GFR, EST NON AFRICAN AMERICAN: 90 mL/min/{1.73_m2} (ref >59)
GLOBULIN, TOTAL: 2.4 g/dL (ref 1.5–4.5)
GLUCOSE: 79 mg/dL (ref 65–99)
Potassium: 3.9 mmol/L (ref 3.5–5.2)
SODIUM: 142 mmol/L (ref 134–144)
TOTAL PROTEIN: 6.6 g/dL (ref 6.0–8.5)

## 2017-07-23 LAB — CBC WITH DIFFERENTIAL/PLATELET
BASOS: 0 %
Basophils Absolute: 0 10*3/uL (ref 0.0–0.2)
EOS (ABSOLUTE): 0.2 10*3/uL (ref 0.0–0.4)
EOS: 3 %
HEMOGLOBIN: 10.7 g/dL — AB (ref 11.1–15.9)
Hematocrit: 34 % (ref 34.0–46.6)
IMMATURE GRANULOCYTES: 0 %
Immature Grans (Abs): 0 10*3/uL (ref 0.0–0.1)
LYMPHS ABS: 2.2 10*3/uL (ref 0.7–3.1)
Lymphs: 32 %
MCH: 27.8 pg (ref 26.6–33.0)
MCHC: 31.5 g/dL (ref 31.5–35.7)
MCV: 88 fL (ref 79–97)
MONOCYTES: 10 %
MONOS ABS: 0.7 10*3/uL (ref 0.1–0.9)
Neutrophils Absolute: 3.8 10*3/uL (ref 1.4–7.0)
Neutrophils: 55 %
Platelets: 343 10*3/uL (ref 150–379)
RBC: 3.85 x10E6/uL (ref 3.77–5.28)
RDW: 14.4 % (ref 12.3–15.4)
WBC: 6.9 10*3/uL (ref 3.4–10.8)

## 2017-07-26 ENCOUNTER — Telehealth: Payer: Self-pay

## 2017-07-26 NOTE — Progress Notes (Signed)
Please inform patient of labs being normal except for hemoglobin being a little low. Patient should eat more dark green leafy foods to increase iron and follow up as planned.

## 2017-07-26 NOTE — Telephone Encounter (Signed)
-----   Message from Margaretmary LombardNubia K [place], [place] sent at 07/26/2017 10:27 AM EDT ----- Please inform patient of labs being normal except for hemoglobin being a little low. Patient should eat more dark green leafy foods to increase iron and follow up as planned.

## 2017-07-26 NOTE — Telephone Encounter (Signed)
CMA call regarding lab results   Patient verify DOB  Patient was aware and understood  

## 2017-08-03 ENCOUNTER — Encounter: Payer: Self-pay | Admitting: Gastroenterology

## 2017-08-03 ENCOUNTER — Ambulatory Visit (INDEPENDENT_AMBULATORY_CARE_PROVIDER_SITE_OTHER): Payer: Self-pay | Admitting: Gastroenterology

## 2017-08-03 VITALS — BP 112/70 | HR 80 | Ht 65.75 in | Wt 182.4 lb

## 2017-08-03 DIAGNOSIS — M5440 Lumbago with sciatica, unspecified side: Secondary | ICD-10-CM

## 2017-08-03 DIAGNOSIS — R194 Change in bowel habit: Secondary | ICD-10-CM

## 2017-08-03 DIAGNOSIS — G8929 Other chronic pain: Secondary | ICD-10-CM

## 2017-08-03 MED ORDER — NA SULFATE-K SULFATE-MG SULF 17.5-3.13-1.6 GM/177ML PO SOLN: 1.0000 | Freq: Once | ORAL | 0 refills | Status: AC

## 2017-08-03 NOTE — Patient Instructions (Signed)
Follow up with your primary care physician of your back and buttock pain.  You have been scheduled for a colonoscopy. Please follow written instructions given to you at your visit today.  Please pick up your prep supplies at the pharmacy within the next 1-3 days. If you use inhalers (even only as needed), please bring them with you on the day of your procedure. Your physician has requested that you go to www.startemmi.com and enter the access code given to you at your visit today. This web site gives a general overview about your procedure. However, you should still follow specific instructions given to you by our office regarding your preparation for the procedure.  Normal BMI (Body Mass Index- based on height and weight) is between 19 and 25. Your BMI today is Body mass index is 29.66 kg/m. Marland Kitchen. Please consider follow up  regarding your BMI with your Primary Care Provider.  Thank you for choosing me and [facility].  Venita LickMalcolm T. Pleas KochStark, Jr., MD., Clementeen GrahamFACG

## 2017-08-03 NOTE — Progress Notes (Signed)
History of Present Illness: This is a 42 year old fem64ale referred by Anders SimmondsMcClung, Angela M, PA-C for the evaluation of a new change in bowel habits, chronic low back pain and new buttock area pain. Spanish translator present for all translation. The patients daughter was present for the entire visit. The patient has had constipation requiring laxatives for several years however now has 2-3 bowel movements per day without laxatives. This change occurred within the past couple months. She notes buttock pain bilaterally for several months. Buttock symptoms are not changed with bowel movements. She has had chronic low back pain with intermittent right sciatic pain for years. Denies weight loss, abdominal pain, diarrhea, change in stool caliber, melena, hematochezia, nausea, vomiting, dysphagia, reflux symptoms, chest pain.    Allergies  Allergen Reactions  . Hydrocodone Nausea And Vomiting and Other (See Comments)    Dizziness    Outpatient Medications Prior to Visit  Medication Sig Dispense Refill  . ibuprofen (ADVIL,MOTRIN) 600 MG tablet Take 1 tablet (600 mg total) by mouth every 8 (eight) hours as needed. (Patient not taking: Reported on 07/22/2017) 30 tablet 0  . nitrofurantoin, macrocrystal-monohydrate, (MACROBID) 100 MG capsule Take 1 capsule (100 mg total) by mouth 2 (two) times daily. (Patient not taking: Reported on 07/22/2017) 6 capsule 0  . triamcinolone cream (KENALOG) 0.1 % Apply 1 application topically 2 (two) times daily. (Patient not taking: Reported on 07/22/2017) 30 g 0   No facility-administered medications prior to visit.    Past Medical History:  Diagnosis Date  . Headache   . Hypothyroidism   . S/P appy   . S/P tubal ligation    Past Surgical History:  Procedure Laterality Date  . APPENDECTOMY  2007  . CESAREAN SECTION  2000  . CESAREAN SECTION  2008  . TUBAL LIGATION  2008   Social History   Socioeconomic History  . Marital status: Married    Spouse name: Not on  file  . Number of children: 4  . Years of education: Not on file  . Highest education level: Not on file  Occupational History  . Not on file  Social Needs  . Financial resource strain: Not on file  . Food insecurity:    Worry: Not on file    Inability: Not on file  . Transportation needs:    Medical: Not on file    Non-medical: Not on file  Tobacco Use  . Smoking status: Never Smoker  . Smokeless tobacco: Never Used  Substance and Sexual Activity  . Alcohol use: No  . Drug use: No  . Sexual activity: Yes    Birth control/protection: Surgical  Lifestyle  . Physical activity:    Days per week: Not on file    Minutes per session: Not on file  . Stress: Not on file  Relationships  . Social connections:    Talks on phone: Not on file    Gets together: Not on file    Attends religious service: Not on file    Active member of club or organization: Not on file    Attends meetings of clubs or organizations: Not on file    Relationship status: Not on file  Other Topics Concern  . Not on file  Social History Narrative  . Not on file   Family History  Problem Relation Age of Onset  . Hypertension Mother   . Diabetes Mother   . Hypertension Father   . Uterine cancer Maternal Grandmother  Review of Systems: Pertinent positive and negative review of systems were noted in the above HPI section. All other review of systems were otherwise negative.   Physical Exam: General: Well developed, well nourished, no acute distress Head: Normocephalic and atraumatic Eyes:  sclerae anicteric, EOMI Ears: Normal auditory acuity Mouth: No deformity or lesions Neck: Supple, no masses or thyromegaly Lungs: Clear throughout to auscultation Heart: Regular rate and rhythm; no murmurs, rubs or bruits Abdomen: Soft, non tender and non distended. No masses, hepatosplenomegaly or hernias noted. Normal Bowel sounds Rectal: no lesions, no tenderness, heme negative brown  stool Musculoskeletal: Symmetrical with no gross deformities  Skin: No lesions on visible extremities Pulses:  Normal pulses noted Extremities: No clubbing, cyanosis, edema or deformities noted Neurological: Alert oriented x 4, grossly nonfocal Cervical Nodes:  No significant cervical adenopathy Inguinal Nodes: No significant inguinal adenopathy Psychological:  Alert and cooperative. Normal mood and affect  Assessment and Recommendations:  1. Change in bowel habits from constipation to frequent stools. R/O colorectal neoplasms, IBD. Schedule colonoscopy. The risks (including bleeding, perforation, infection, missed lesions, medication reactions and possible hospitalization or surgery if complications occur), benefits, and alternatives to colonoscopy with possible biopsy and possible polypectomy were discussed with the patient and they consent to proceed.   2. Chronic low back pain, bilateral buttock pain, right sciatic pain. This is not GI related. R/O spine related disorder. Return to PCP for further evaluation.      cc: Anders Simmonds, PA-C [address] [place], [place] [address]

## 2017-08-16 MED FILL — SUPREP BOWEL PREP KIT: 17.5-3.13-1 | 1 days supply | Qty: 354 | Fill #0

## 2017-08-25 ENCOUNTER — Ambulatory Visit: Payer: Self-pay | Attending: Internal Medicine | Admitting: Physician Assistant

## 2017-08-25 VITALS — BP 131/87 | HR 71 | Temp 98.9°F | Resp 16 | Ht 66.0 in | Wt 178.2 lb

## 2017-08-25 DIAGNOSIS — E039 Hypothyroidism, unspecified: Secondary | ICD-10-CM | POA: Insufficient documentation

## 2017-08-25 DIAGNOSIS — Z789 Other specified health status: Secondary | ICD-10-CM

## 2017-08-25 DIAGNOSIS — M545 Low back pain, unspecified: Secondary | ICD-10-CM

## 2017-08-25 DIAGNOSIS — Z885 Allergy status to narcotic agent status: Secondary | ICD-10-CM | POA: Insufficient documentation

## 2017-08-25 DIAGNOSIS — Z9851 Tubal ligation status: Secondary | ICD-10-CM | POA: Insufficient documentation

## 2017-08-25 DIAGNOSIS — G8929 Other chronic pain: Secondary | ICD-10-CM

## 2017-08-25 MED ORDER — NAPROXEN 500 MG PO TABS: 500.0000 mg | ORAL_TABLET | Freq: Two times a day (BID) | ORAL | 1 refills | Status: DC

## 2017-08-25 MED ORDER — METHOCARBAMOL 500 MG PO TABS: 500.0000 mg | ORAL_TABLET | Freq: Three times a day (TID) | ORAL | 1 refills | Status: DC

## 2017-08-25 MED FILL — METHOCARBAMOL 500 MG TABS: 500 | 30 days supply | Qty: 90 | Fill #0

## 2017-08-25 NOTE — Patient Instructions (Signed)
Prevencin de las lesiones de la espalda Back Injury Prevention Las lesiones de la espalda pueden ser muy dolorosas. Adems son difciles de curar. Despus de haber tenido una lesin de la espalda, tiene ms probabilidad de lesionarse otra vez. Es importante que aprenda cmo evitar lesionarse o volver a Control and instrumentation engineer. Los siguientes consejos pueden ayudarlo a Product/process development scientist una lesin de la espalda. Qu debo saber sobre el estado fsico?  Haga ejercicios durante 38mnutos diarios la mHartford Financialde la semana o como se lo haya indicado el mdico. AChief Strategy Officerde lo siguiente: ? Haga ejercicios aerbicos, como caminar, trotar, andar en bicicleta o nadar. ? Haga ejercicios que mejoren el equilibrio y la fuerza, cDibolltai chi y el yoga. Estos pueden reducir el riesgo de que se caiga y se lesione la espalda. ? Haga ejercicios de elongacin para ayudar a la flexibilidad. ? Intente desarrollar msculos abdominales fuertes. Los msculos del abdomen brindan gran parte del sostn que la espalda necesita.  Mantenga un peso saludable. Esto ayuda a reducir eCatering managerde sufrir una lesin de la espalda. Qu debo saber sobre la dieta?  Hable con el mdico sobre la dieta en general. TLubbockvitaminas y los suplementos solamente como se lo haya indicado el mdico.  Hable con el mdico sobre la cantidad de calcio y vitaminaD que necesita a diario. Estos nutrientes ayudan a eInsurance risk surveyorde los huesos (osteoporosis). La osteoporosis puede derivar en fracturas seas, lo que puede causar dolor de espalda.  Incluya buenas fuentes de calcio en la dieta, como productos lcteos, verduras de hojas verdes y productos con agregado de calcio (fortificados).  Incluya buenas fuentes de vitaminaD en la dieta, como leche y alimentos fortificados con esta vitamina. Qu debo saber sobre la postura?  Sintese y prese erguido. No se incline hacia adelante al sentarse ni se encorve al pararse.  Elija las  sillas con un buen apoyo para la zona baja de la espalda (lumbar).  Si trabaja en un escritorio, sintese cerca de este para no tener que inclinarse. Mantenga el mentn hacia abajo. Mantenga el cuello hacia atrs y los codos flexionados en ngulo recto. La posicin de los brazos debe verse como la letra "L".  Cuando conduzca, sintese elevado y cerca del volante. Agregue un apoyo para la zona lumbar al asiento del automvil, si es necesario.  No permanezca sentado ni parado en una posicin durante mThe PNC Financial Tmese descansos para pararse, estirarse y caminar, al menos una vez por hora. Tmese descansos cada hora si conduce durante perodos largos de tiempo.  Duerma de costado con las rodillas apenas dobladas o boca arriba con una almohada debajo de las rodillas. No duerma boca abajo. Qu debo saber acerca del levantamiento de objetos y de los movimientos de torsin y de estiramiento? Levantamiento de objetos y de cargas pesadas   No levante cargas pesadas y evite especialmente el levantamiento repetitivo de objetos pesados. Si debe levantar cargas pesadas: ? Elongue antes de hacerlo. ? Trabaje lentamente. ? Descanse despus de cada levantamiento. ? Use una herramienta, como un carrito o una plataforma mvil para mover los objetos, si hay una disponible. ? Haga varios viajes cortos, en lugar de llevar una carga pesada. ? Pida ayuda cuando la necesite, en especial cuando mueva objetos de gran tamao.  Siga estos pasos cuando levante objetos: ? Prese con los pies separados al ancho de los hombros. ? Acrquese al oComcastpueda. No intente levantar un objeto pesado que est  lejos de su cuerpo. ? Use agarraderas o correas de elevacin si estn disponibles. ? [place]. [place], pero mantenga los Henry Schein. ? Mantenga los PG[employer]E Corporation, el mentn hacia abajo y la espalda derecha. ? Levante lentamente el International Paper contrae los msculos de las  piernas, el abdomen y las nalgas. Mantenga el objeto tan cerca del centro del cuerpo como sea posible.  Siga estos pasos cuando baje una carga pesada: ? Prese con los pies separados al ancho de los hombros. ? Baje lentamente el objeto mientras contrae los msculos de las piernas, el abdomen y las nalgas. Mantenga el objeto tan cerca del centro del cuerpo como sea posible. ? Mantenga los [employer], el mentn hacia abajo y la espalda derecha. ? [place]. [place], pero mantenga los Henry Schein. ? Use agarraderas o correas de elevacin si estn disponibles. Movimientos de torsin y de estiramiento  No levante objetos pesados por encima del nivel de la cintura.  No tuerza la cintura mientras levanta o transporta una carga. Si tiene que girar, Sears Holdings Corporation.  No se incline sin flexionar las rodillas.  No se estire para alcanzar un objeto que est por encima de su cabeza, al otro lado de una mesa o sobre una superficie elevada. Cules son algunos otros consejos?  Evite los pisos mojados y los suelos helados. Retire el hielo de las aceras para evitar las cadas.  No duerma sobre un colchn muy blando ni muy duro.  Mantenga los objetos que [place] a menudo en lugares de fcil acceso.  Coloque los objetos ms pesados en estantes a nivel de la cintura y los ms livianos en estantes ms bajos o ms altos.  Encuentre formas de reducir Dealer, como hacer ejercicios, darse masajes o aplicar tcnicas de relajacin. El estrs puede [employer]el Foods. Los msculos en estado de tensin son ms propensos a las lesiones.  Hable con el mdico si se siente ansioso o deprimido. Estas afecciones pueden intensificar el dolor de espalda.  Use calzado sin tacones con suelas acolchonadas.  Evite los movimientos repentinos.  Use ambas correas de sujecin cuando cargue una mochila.  No consuma ningn producto que contenga tabaco, lo que incluye cigarrillos, tabaco de  Higher education careers adviser o Psychologist, sport and exercise. Si necesita ayuda para dejar de fumar, consulte al mdico. Esta informacin no tiene Marine scientist el consejo del mdico. Asegrese de hacerle al mdico cualquier pregunta que tenga. Document Released: 04/06/2005 Document Revised: 07/06/2016 Document Reviewed: 04/10/2014 Elsevier Interactive Patient Education  Henry [employer]in.

## 2017-08-25 NOTE — Progress Notes (Signed)
Pt. Is here wanting a referral to Orthopedic for her back pain.

## 2017-08-25 NOTE — Progress Notes (Signed)
Patient ID: Tracy Blevins, female   DOB: [birth year], 42 y.o.   MRN: [id number]       Tracy Blevins, is a 42 y.o. female  [id number]  [id number]  DOB - 05-19-1975  Subjective:  Chief Complaint and HPI: Tracy Blevins [name] is a 42 y.o. female here today with Still having pain in lower back esp on the L side.  This has been going on for about 3 months. No weakness.  "Problems with sciatic nerve for ~10 years."  Pain was precipitated by bending over to pick something up a few months ago.  Wants to see a specialist.  Saw GI because she thought her BMs were precipitating the back pain, but the GI work-up checked out ok.    "Tracy Blevins" with [employer].   ROS:   Constitutional:  No f/c, No night sweats, No unexplained weight loss. EENT:  No vision changes, No blurry vision, No hearing changes. No mouth, throat, or ear problems.  Respiratory: No cough, No SOB Cardiac: No CP, no palpitations GI:  No abd pain, No N/V/D. GU: No Urinary s/sx Musculoskeletal: +LBP Neuro: No headache, no dizziness, no motor weakness.  Skin: No rash Endocrine:  No polydipsia. No polyuria.  Psych: Denies SI/HI  No problems updated.  ALLERGIES: Allergies  Allergen Reactions  . Hydrocodone Nausea And Vomiting and Other (See Comments)    Dizziness     PAST MEDICAL HISTORY: Past Medical History:  Diagnosis Date  . Headache   . Hypothyroidism   . S/P appy   . S/P tubal ligation     MEDICATIONS AT HOME: Prior to Admission medications   Medication Sig Start Date End Date Taking? Authorizing Provider  methocarbamol (ROBAXIN) 500 MG tablet Take 1 tablet (500 mg total) by mouth 3 (three) times daily. X 7 days then prn muscle spasms 08/25/17   Georgian Co M, PA-C  naproxen (NAPROSYN) 500 MG tablet Take 1 tablet (500 mg total) by mouth 2 (two) times daily with a meal. X 1 week then prn pain 08/25/17   Anders Simmonds, PA-C     Objective:  EXAM:   Vitals:   08/25/17 0859  BP: 131/87    Pulse: 71  Resp: 16  Temp: 98.9 F (37.2 C)  TempSrc: Oral  SpO2: 96%  Weight: 178 lb 3.2 oz (80.8 kg)  Height:  (1.676 m)    General appearance : A&OX3. NAD. Non-toxic-appearing HEENT: Atraumatic and Normocephalic.  PERRLA. EOM intact.   Neck: supple, no JVD. No cervical lymphadenopathy. No thyromegaly Chest/Lungs:  Breathing-non-labored, Good air entry bilaterally, breath sounds normal without rales, rhonchi, or wheezing  CVS: S1 S2 regular, no murmurs, gallops, rubs  Back:  No spiny TTP or step off.  Mild paraspinus spasm in lower back.  No S-I TTP.  Neg SLR B.  DTR=B throughout.   Extremities: Bilateral Lower Ext shows no edema, both legs are warm to touch with = pulse throughout Neurology:  CN II-XII grossly intact, Non focal.   Psych:  TP linear. J/I WNL. Normal speech. Appropriate eye contact and affect.  Skin:  No Rash  Data Review No results found for: HGBA1C   Assessment & Plan   1. Chronic bilateral low back pain without sciatica Believe musculoskeletal-will refer for reassurance.  No red flags - naproxen (NAPROSYN) 500 MG tablet; Take 1 tablet (500 mg total) by mouth 2 (two) times daily with a meal. X 1 week then prn pain  Dispense: 60 tablet; Refill: 1 -  methocarbamol (ROBAXIN) 500 MG tablet; Take 1 tablet (500 mg total) by mouth 3 (three) times daily. X 7 days then prn muscle spasms  Dispense: 90 tablet; Refill: 1 Refer ortho  2. Language barrier stratus interpreters used and additional time performing visit was required.    Patient have been counseled extensively about nutrition and exercise  Return in about 3 months (around 11/25/2017) for assign new PCP.  The patient was given clear instructions to go to ER or return to medical center if symptoms don't improve, worsen or new problems develop. The patient verbalized understanding. The patient was told to call to get lab results if they haven't heard anything in the next week.     Georgian Co,  PA-C [facility] [place], [place] [phone]   08/25/2017, 9:20 AM

## 2017-09-01 ENCOUNTER — Other Ambulatory Visit: Payer: Self-pay

## 2017-09-01 ENCOUNTER — Encounter: Payer: Self-pay | Admitting: Gastroenterology

## 2017-09-01 ENCOUNTER — Ambulatory Visit (AMBULATORY_SURGERY_CENTER): Payer: Self-pay | Admitting: Gastroenterology

## 2017-09-01 VITALS — BP 113/68 | HR 65 | Temp 98.9°F | Resp 16 | Ht 66.0 in | Wt 178.0 lb

## 2017-09-01 DIAGNOSIS — R194 Change in bowel habit: Secondary | ICD-10-CM

## 2017-09-01 MED ORDER — SODIUM CHLORIDE 0.9 % IV SOLN: 500.0000 mL | Freq: Once | INTRAVENOUS | Status: AC

## 2017-09-01 NOTE — Patient Instructions (Signed)
Repeat colonoscopy in 10 years. Continue present medications.   YOU HAD AN ENDOSCOPIC PROCEDURE TODAY AT THE [facility]:   Refer to the procedure report that was given to you for any specific questions about what was found during the examination.  If the procedure report does not answer your questions, please call your gastroenterologist to clarify.  If you requested that your care partner not be given the details of your procedure findings, then the procedure report has been included in a sealed envelope for you to review at your convenience later.  YOU SHOULD EXPECT: Some feelings of bloating in the abdomen. Passage of more gas than usual.  Walking can help get rid of the air that was put into your GI tract during the procedure and reduce the bloating. If you had a lower endoscopy (such as a colonoscopy or flexible sigmoidoscopy) you may notice spotting of blood in your stool or on the toilet paper. If you underwent a bowel prep for your procedure, you may not have a normal bowel movement for a few days.  Please Note:  You might notice some irritation and congestion in your nose or some drainage.  This is from the oxygen used during your procedure.  There is no need for concern and it should clear up in a day or so.  SYMPTOMS TO REPORT IMMEDIATELY:   Following lower endoscopy (colonoscopy or flexible sigmoidoscopy):  Excessive amounts of blood in the stool  Significant tenderness or worsening of abdominal pains  Swelling of the abdomen that is new, acute  Fever of 100F or higher   For urgent or emergent issues, a gastroenterologist can be reached at any hour by calling [phone].   DIET:  We do recommend a small meal at first, but then you may proceed to your regular diet.  Drink plenty of fluids but you should avoid alcoholic beverages for 24 hours.  ACTIVITY:  You should plan to take it easy for the rest of today and you should NOT DRIVE or use heavy machinery until  tomorrow (because of the sedation medicines used during the test).    FOLLOW UP: Our staff will call the number listed on your records the next business day following your procedure to check on you and address any questions or concerns that you may have regarding the information given to you following your procedure. If we do not reach you, we will leave a message.  However, if you are feeling well and you are not experiencing any problems, there is no need to return our call.  We will assume that you have returned to your regular daily activities without incident.  If any biopsies were taken you will be contacted by phone or by letter within the next 1-3 weeks.  Please call us at [phone] if you have not heard about the biopsies in 3 weeks.    SIGNATURES/CONFIDENTIALITY: You and/or your care partner have signed paperwork which will be entered into your electronic medical record.  These signatures attest to the fact that that the information above on your After Visit Summary has been reviewed and is understood.  Full responsibility of the confidentiality of this discharge information lies with you and/or your care-partner.

## 2017-09-01 NOTE — Progress Notes (Signed)
Pt's states no medical or surgical changes since previsit or office visit.  No soy or egg allergy

## 2017-09-01 NOTE — Op Note (Signed)
[facility] Patient Name: Tracy Blevins Procedure Date: 09/01/2017 2:32 PM MRN: [id number] Endoscopist: Meryl Dare , MD Age: 42 Referring MD:  Date of Birth: Sep 25, 1975 Gender: Female Account #: [id number] Procedure:                Colonoscopy Indications:              Change in bowel habits Medicines:                Monitored Anesthesia Care Procedure:                Pre-Anesthesia Assessment:                           - Prior to the procedure, a History and Physical                            was performed, and patient medications and                            allergies were reviewed. The patient's tolerance of                            previous anesthesia was also reviewed. The risks                            and benefits of the procedure and the sedation                            options and risks were discussed with the patient.                            All questions were answered, and informed consent                            was obtained. Prior Anticoagulants: The patient has                            taken no previous anticoagulant or antiplatelet                            agents. ASA Grade Assessment: II - A patient with                            mild systemic disease. After reviewing the risks                            and benefits, the patient was deemed in                            satisfactory condition to undergo the procedure.                           After obtaining informed consent, the colonoscope  was passed under direct vision. Throughout the                            procedure, the patient's blood pressure, pulse, and                            oxygen saturations were monitored continuously. The                            Colonoscope was introduced through the anus and                            advanced to the the terminal ileum, with                            identification of the appendiceal orifice  and IC                            valve. The ileocecal valve, appendiceal orifice,                            and rectum were photographed. The quality of the                            bowel preparation was excellent. The colonoscopy                            was performed without difficulty. The patient                            tolerated the procedure well. Scope In: 2:43:14 PM Scope Out: 2:58:24 PM Scope Withdrawal Time: 0 hours 11 minutes 53 seconds  Total Procedure Duration: 0 hours 15 minutes 10 seconds  Findings:                 The perianal and digital rectal examinations were                            normal.                           The entire examined colon appeared normal on direct                            and retroflexion views.                           The terminal ileum appeared normal. Complications:            No immediate complications. Estimated blood loss:                            None. Estimated Blood Loss:     Estimated blood loss: none. Impression:               - The entire examined colon is normal on direct and  retroflexion views.                           - The examined portion of the ileum was normal.                           - No specimens collected. Recommendation:           - Repeat colonoscopy in 10 years for screening                            purposes.                           - Patient has a contact number available for                            emergencies. The signs and symptoms of potential                            delayed complications were discussed with the                            patient. Return to normal activities tomorrow.                            Written discharge instructions were provided to the                            patient.                           - Resume previous diet.                           - Continue present medications. Meryl Dare, MD 09/01/2017 3:02:03 PM This report  has been signed electronically.

## 2017-09-01 NOTE — Progress Notes (Signed)
Report to PACU, RN, vss, BBS= Clear.  

## 2017-09-02 ENCOUNTER — Telehealth: Payer: Self-pay

## 2017-09-02 NOTE — Telephone Encounter (Signed)
  Follow up Call-  Call Dwayn Moravek number 09/01/2017  Post procedure Call Obrian Bulson phone  # [phone]  Permission to leave phone message Yes  Some recent data might be hidden     Patient questions:  Do you have a fever, pain , or abdominal swelling? No. Pain Score  0 *  Have you tolerated food without any problems? Yes.    Have you been able to return to your normal activities? Yes.    Do you have any questions about your discharge instructions: Diet   No. Medications  No. Follow up visit  No.  Do you have questions or concerns about your Care? No.  Actions: * If pain score is 4 or above: No action needed, pain <4.

## 2017-09-06 ENCOUNTER — Ambulatory Visit: Payer: Self-pay | Admitting: Internal Medicine

## 2017-10-13 ENCOUNTER — Ambulatory Visit (INDEPENDENT_AMBULATORY_CARE_PROVIDER_SITE_OTHER): Payer: Self-pay

## 2017-10-13 ENCOUNTER — Encounter (INDEPENDENT_AMBULATORY_CARE_PROVIDER_SITE_OTHER): Payer: Self-pay | Admitting: Orthopedic Surgery

## 2017-10-13 ENCOUNTER — Ambulatory Visit (INDEPENDENT_AMBULATORY_CARE_PROVIDER_SITE_OTHER): Payer: Self-pay | Admitting: Orthopedic Surgery

## 2017-10-13 DIAGNOSIS — M79605 Pain in left leg: Secondary | ICD-10-CM

## 2017-10-13 DIAGNOSIS — M5416 Radiculopathy, lumbar region: Secondary | ICD-10-CM

## 2017-10-13 NOTE — Progress Notes (Signed)
Office Visit Note   Patient: Tracy Blevins           Date of Birth: 24-Jul-1975           MRN: [id number] Visit Date: 10/13/2017 Requested by: Anders Simmonds, PA-C [address] [place], [place] [address] PCP: Lizbeth Bark, FNP  Subjective: Chief Complaint  Patient presents with  . Back Pain    with radicular leg pain    HPI: Keirra is a patient with atraumatic onset low back pain for 5 months.  Denies any history of injury.  Reports pain in both legs left worse than right.  She does report some radiating pain as well as pain that affects both groin.  She has had x-rays of her back which are reviewed which are unremarkable.  She reports buttock pain and pain that wakes her from sleep at night.  She takes ibuprofen which has helped before but is not helping much recently.  Had similar episode years ago.  That resolved with what sounds like anti-inflammatory medication.  Hurts her to lift things.  She is currently not working outside the home.              ROS: All systems reviewed are negative as they relate to the chief complaint within the history of present illness.  Patient denies  fevers or chills.   Assessment & Plan: Visit Diagnoses:  1. Pain in left leg     Plan: Impression is low back pain with some radiation into the left and right hand side.  Radiographs unremarkable.  She is taking anti-inflammatories and is worked on some workout machines that she has at home.  None of this is been effective.  Her symptoms are getting worse.  Has pain on a daily basis.  I think she has some radiculopathy on that left-hand side.  Plan MRI scan to evaluate for left-sided radiculopathy.  Follow-up with me after that with possible ESI's to follow.  Follow-Up Instructions: No follow-ups on file.   Orders:  Orders Placed This Encounter  Procedures  . XR HIP UNILAT W OR W/O PELVIS 2-3 VIEWS LEFT   No orders of the defined types were placed in this encounter.      Procedures: No procedures performed   Clinical Data: No additional findings.  Objective: Vital Signs: There were no vitals taken for this visit.  Physical Exam:   Constitutional: Patient appears well-developed HEENT:  Head: Normocephalic Eyes:EOM are normal Neck: Normal range of motion Cardiovascular: Normal rate Pulmonary/chest: Effort normal Neurologic: Patient is alert Skin: Skin is warm Psychiatric: Patient has normal mood and affect    Ortho Exam: Ortho exam demonstrates normal gait and alignment.  No definite nerve root tension signs today.  Patient has 5 out of 5 ankle dorsiflexion plantarflexion quad and hamstring strength.  No groin pain with internal/external rotation of the leg.  Patient does have some pain with forward standing and lateral bending.  Pedal pulses palpable.  Reflexes symmetric 1+ out of 4 bilateral patella and Achilles.  No paresthesias L1-S1 bilaterally.  Specialty Comments:  No specialty comments available.  Imaging: Xr Hip Unilat W Or W/o Pelvis 2-3 Views Left  Result Date: 10/13/2017 AP pelvis lateral left hip reviewed.  No significant hip joint arthritis is present.  Proximal femurs bilaterally normal.  SI joints intact.  No radiographic bony abnormality of the pelvis or hip is evident.    PMFS History: There are no active problems to display for this  patient.  Past Medical History:  Diagnosis Date  . Headache   . Hypothyroidism   . S/P appy   . S/P tubal ligation     Family History  Problem Relation Age of Onset  . Hypertension Mother   . Diabetes Mother   . Hypertension Father   . Uterine cancer Maternal Grandmother     Past Surgical History:  Procedure Laterality Date  . APPENDECTOMY  2007  . CESAREAN SECTION  2000  . CESAREAN SECTION  2008  . TUBAL LIGATION  2008   Social History   Occupational History  . Not on file  Tobacco Use  . Smoking status: Never Smoker  . Smokeless tobacco: Never Used  Substance and  Sexual Activity  . Alcohol use: No  . Drug use: No  . Sexual activity: Yes    Birth control/protection: Surgical

## 2017-10-13 NOTE — Addendum Note (Signed)
Addended byPrescott Parma: Shanayah Kaffenberger on: 10/13/2017 10:07 AM   Modules accepted: Orders

## 2017-11-01 ENCOUNTER — Ambulatory Visit
Admission: RE | Admit: 2017-11-01 | Discharge: 2017-11-01 | Disposition: A | Discharge status: Home / Self Care | Payer: Self-pay | Source: Ambulatory Visit | Attending: Orthopedic Surgery | Admitting: Orthopedic Surgery

## 2017-11-01 DIAGNOSIS — M5416 Radiculopathy, lumbar region: Secondary | ICD-10-CM

## 2017-11-05 ENCOUNTER — Other Ambulatory Visit: Payer: Self-pay | Admitting: Obstetrics and Gynecology

## 2017-11-05 DIAGNOSIS — Z1231 Encounter for screening mammogram for malignant neoplasm of breast: Secondary | ICD-10-CM

## 2017-11-15 ENCOUNTER — Ambulatory Visit (INDEPENDENT_AMBULATORY_CARE_PROVIDER_SITE_OTHER): Payer: Self-pay | Admitting: Orthopedic Surgery

## 2017-11-15 ENCOUNTER — Encounter (INDEPENDENT_AMBULATORY_CARE_PROVIDER_SITE_OTHER): Payer: Self-pay | Admitting: Orthopedic Surgery

## 2017-11-15 DIAGNOSIS — M545 Low back pain: Secondary | ICD-10-CM

## 2017-11-15 DIAGNOSIS — G8929 Other chronic pain: Secondary | ICD-10-CM

## 2017-11-15 NOTE — Progress Notes (Signed)
Office Visit Note   Patient: Tracy Blevins           Date of Birth: [birth year]           MRN: [id number] Visit Date: 11/15/2017 Requested by: Lizbeth BarkHairston, Mandesia R, FNP No address on file PCP: Lizbeth BarkHairston, Mandesia R, FNP  Subjective: Chief Complaint  Patient presents with  . Lower Back - Follow-up    HPI: Patient presents for evaluation of low back pain.  Since I have seen her she has had MRI of her lumbar spine.  I have reviewed that with her and the interpreter today.  This does show left-sided L5-S1 likely nerve root irritation from bulging disc into the left foramen.  Symptoms have been going on for a year but getting worse over the last 4 months.  Does describe radicular symptoms on both the left-hand side and to a lesser extent the right-hand side.  She is unable to do activities of daily living without symptoms.              ROS: All systems reviewed are negative as they relate to the chief complaint within the history of present illness.  Patient denies  fevers or chills.   Assessment & Plan: Visit Diagnoses:  1. Chronic low back pain, unspecified back pain laterality, with sciatica presence unspecified     Plan: Impression is low back pain with cooperating findings on MRI scan.  Plan is to refer her to Dr. Alvester MorinNewton for lumbar spine ESI x1 were to and then physical therapy to start a month from now.  I do not see this is a surgical problem.  No nerve root tension signs today.  Primarily activity related back pain.  Follow-Up Instructions: No follow-ups on file.   Orders:  Orders Placed This Encounter  Procedures  . Ambulatory referral to Physical Therapy  . Ambulatory referral to Physical Medicine Rehab   No orders of the defined types were placed in this encounter.     Procedures: No procedures performed   Clinical Data: No additional findings.  Objective: Vital Signs: There were no vitals taken for this visit.  Physical Exam:   Constitutional: Patient  appears well-developed HEENT:  Head: Normocephalic Eyes:EOM are normal Neck: Normal range of motion Cardiovascular: Normal rate Pulmonary/chest: Effort normal Neurologic: Patient is alert Skin: Skin is warm Psychiatric: Patient has normal mood and affect    Ortho Exam: Ortho exam demonstrates full active and passive range of motion of bilateral knees hips and ankles.  No nerve root tension signs today and no paresthesias L5-S1.  Heel pulses palpable.  No groin pain with internal/external rotation of the leg.  Specialty Comments:  No specialty comments available.  Imaging: No results found.   PMFS History: There are no active problems to display for this patient.  Past Medical History:  Diagnosis Date  . Headache   . Hypothyroidism   . S/P appy   . S/P tubal ligation     Family History  Problem Relation Age of Onset  . Hypertension Mother   . Diabetes Mother   . Hypertension Father   . Uterine cancer Maternal Grandmother     Past Surgical History:  Procedure Laterality Date  . APPENDECTOMY  2007  . CESAREAN SECTION  2000  . CESAREAN SECTION  2008  . TUBAL LIGATION  2008   Social History   Occupational History  . Not on file  Tobacco Use  . Smoking status: Never Smoker  . Smokeless tobacco:  Never Used  Substance and Sexual Activity  . Alcohol use: No  . Drug use: No  . Sexual activity: Yes    Birth control/protection: Surgical

## 2017-11-24 ENCOUNTER — Ambulatory Visit: Payer: Self-pay | Admitting: Family Medicine

## 2017-12-01 ENCOUNTER — Ambulatory Visit: Payer: No Typology Code available for payment source

## 2017-12-02 ENCOUNTER — Encounter (HOSPITAL_COMMUNITY): Payer: Self-pay

## 2017-12-02 ENCOUNTER — Ambulatory Visit (HOSPITAL_COMMUNITY)
Admission: RE | Admit: 2017-12-02 | Discharge: 2017-12-02 | Disposition: A | Discharge status: Home / Self Care | Payer: Self-pay | Source: Ambulatory Visit | Attending: Obstetrics and Gynecology | Admitting: Obstetrics and Gynecology

## 2017-12-02 ENCOUNTER — Ambulatory Visit
Admission: RE | Admit: 2017-12-02 | Discharge: 2017-12-02 | Disposition: A | Discharge status: Home / Self Care | Payer: No Typology Code available for payment source | Source: Ambulatory Visit | Attending: Obstetrics and Gynecology | Admitting: Obstetrics and Gynecology

## 2017-12-02 VITALS — BP 135/70 | Ht 65.0 in

## 2017-12-02 DIAGNOSIS — Z01419 Encounter for gynecological examination (general) (routine) without abnormal findings: Secondary | ICD-10-CM

## 2017-12-02 DIAGNOSIS — Z1231 Encounter for screening mammogram for malignant neoplasm of breast: Secondary | ICD-10-CM

## 2017-12-02 NOTE — Progress Notes (Addendum)
No complaints today.   Pap Smear: Pap smear completed today. [facility] and normal with positive HPV. Per patient has no history of an abnormal Pap smear. Last Pap smear result is in Epic.  Physical exam: Breasts Breasts symmetrical. No skin abnormalities bilateral breasts. No nipple retraction bilateral breasts. No nipple discharge bilateral breasts. No lymphadenopathy. No lumps palpated bilateral breasts. No complaints of pain or tenderness on exam. [facility]. Appointment scheduled for Thursday, December 02, 2017 at 1540.        Pelvic/Bimanual   Ext Genitalia No lesions, no swelling and no discharge observed on external genitalia.         Vagina Vagina pink and normal texture. No lesions or discharge observed in vagina.          Cervix Cervix is present. Cervix pink and of normal texture. Cervix friable. No discharge observed.     Uterus Uterus is present and palpable. Uterus in normal position and normal size.        Adnexae Bilateral ovaries present and palpable. No tenderness on palpation.         Rectovaginal No rectal exam completed today since patient had no rectal complaints. No skin abnormalities observed on exam.    Smoking History: Patient has never smoked.  Patient Navigation: Patient education provided. Access to services provided for [facility] through [facility] program. Spanish interpreter provided.   Breast and Cervical Cancer Risk Assessment: Patient has no family history of breast cancer, known genetic mutations, or radiation treatment to the chest before age 40. Patient has no history of cervical dysplasia, immunocompromised, or DES exposure in-utero. Risk Assessment    Risk Scores      12/02/2017   Last edited by: Priscille HeidelbergBrannock, Christine P, RN   5-year risk: 0.3 %   Lifetime risk: 5.8 %         Used Spanish interpreter Natale LayErika McReynolds  from [facility].

## 2017-12-02 NOTE — Patient Instructions (Signed)
Explained breast self awareness with [name]. Let patient know that if today's Pap smear is normal that her next Pap smear is due in one year due to her last Pap smear was HPV positive. [facility]. Appointment scheduled for Thursday, December 02, 2017 at 1540. Let patient know will follow up with her within the next couple weeks with results of Pap smear by letter or phone. Informed patient that the Breast Center will follow-up with her within the next couple of weeks with results of mammogram by letter or phone. Tracy Blevins verbalized understanding.  Julie Nay, [name], RN 3:12 PM

## 2017-12-02 NOTE — Addendum Note (Signed)
Encounter addended by: Priscille HeidelbergBrannock, Jancie Kercher P, RN on: 12/02/2017 3:39 PM  Actions taken: Sign clinical note

## 2017-12-03 LAB — CYTOLOGY - PAP
Diagnosis: NEGATIVE
HPV: NOT DETECTED

## 2017-12-06 ENCOUNTER — Encounter (INDEPENDENT_AMBULATORY_CARE_PROVIDER_SITE_OTHER): Payer: Self-pay | Admitting: Physical Medicine and Rehabilitation

## 2017-12-06 ENCOUNTER — Other Ambulatory Visit (HOSPITAL_COMMUNITY): Payer: Self-pay | Admitting: *Deleted

## 2017-12-06 DIAGNOSIS — Z0000 Encounter for general adult medical examination without abnormal findings: Secondary | ICD-10-CM

## 2017-12-08 ENCOUNTER — Other Ambulatory Visit: Payer: No Typology Code available for payment source

## 2017-12-08 ENCOUNTER — Ambulatory Visit: Payer: No Typology Code available for payment source

## 2017-12-21 ENCOUNTER — Encounter (HOSPITAL_COMMUNITY): Payer: Self-pay | Admitting: *Deleted

## 2017-12-21 NOTE — Progress Notes (Signed)
Letter mailed to patient with negative pap smear results. HPV was negative. Next pap smear due in one year.  

## 2019-01-10 ENCOUNTER — Other Ambulatory Visit (HOSPITAL_COMMUNITY): Payer: Self-pay | Admitting: *Deleted

## 2019-01-10 DIAGNOSIS — N644 Mastodynia: Secondary | ICD-10-CM

## 2019-01-16 ENCOUNTER — Ambulatory Visit (INDEPENDENT_AMBULATORY_CARE_PROVIDER_SITE_OTHER): Payer: Self-pay

## 2019-01-16 ENCOUNTER — Ambulatory Visit (HOSPITAL_COMMUNITY)
Admission: EM | Admit: 2019-01-16 | Discharge: 2019-01-16 | Disposition: A | Discharge status: Home / Self Care | Payer: Self-pay | Attending: Family Medicine | Admitting: Family Medicine

## 2019-01-16 ENCOUNTER — Encounter (HOSPITAL_COMMUNITY): Payer: Self-pay

## 2019-01-16 ENCOUNTER — Other Ambulatory Visit: Payer: Self-pay

## 2019-01-16 DIAGNOSIS — M545 Low back pain, unspecified: Secondary | ICD-10-CM

## 2019-01-16 DIAGNOSIS — G8929 Other chronic pain: Secondary | ICD-10-CM

## 2019-01-16 DIAGNOSIS — X501XXA Overexertion from prolonged static or awkward postures, initial encounter: Secondary | ICD-10-CM

## 2019-01-16 DIAGNOSIS — S93402A Sprain of unspecified ligament of left ankle, initial encounter: Secondary | ICD-10-CM

## 2019-01-16 MED ORDER — NAPROXEN 500 MG PO TABS: 500.0000 mg | ORAL_TABLET | Freq: Two times a day (BID) | ORAL | 1 refills | Status: DC

## 2019-01-16 MED FILL — NAPROXEN 500 MG TABLET: 500 | 15 days supply | Qty: 30 | Fill #0

## 2019-01-16 NOTE — Discharge Instructions (Signed)
°  su radiografa no muestra ningn hallazgo de [employer]. naproxeno [employer] al da, tomar con alimentos. esto ayudar con el dolor. actividad segn la tolerancia. 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 levante el pie y aplique hielo al final del da. ver ejercicios proporcionados para fortalecer su tobillo  your xray does not show any findings of broken bones.  naproxen twice a day, take with food. this will help with pain.  activity as tolerated.  may use your wrap as well as the brace provided.  elevate your foot and apply ice at the end of your day.  see exercises provided to strengthen your ankle

## 2019-01-16 NOTE — ED Triage Notes (Signed)
Pt presents with left foot pain not associated with any foot injury X 3 weeks that has progressed with pain and swelling.

## 2019-01-16 NOTE — ED Provider Notes (Signed)
[facility]    CSN: [id number] Arrival date & time: 01/16/19  5093      History   Chief Complaint Chief Complaint  Patient presents with  . Foot Pain    HPI [name] is a 43 y.o. female.   [facility] presents with complaints of left ankle and foot pain after she stepped wrong off of a step 3 weeks ago. She felt a snap. Pain with dorsiflexion and with weight bearing. Swells with increased use. Has been using an ACE wrap as well as tylenol which haven't helped. Has been using a cane which does help. No redness or warmth. Throbs at night. Sometimes with throbbing she feels some numbness to three of her toes, doesn't feel this always, however. Denies  Any previous injury to the foot or ankle. History  Of hypothyroidism, headaches.   Spanish video interpreter used to collect history and physical exam.    ROS per HPI, negative if not otherwise mentioned.      Past Medical History:  Diagnosis Date  . Headache   . Hypothyroidism   . S/P appy   . S/P tubal ligation     There are no active problems to display for this patient.   Past Surgical History:  Procedure Laterality Date  . APPENDECTOMY  2007  . CESAREAN SECTION  2000  . CESAREAN SECTION  2008  . TUBAL LIGATION  2008    OB History    Gravida  4   Para  4   Term      Preterm      AB      Living  4     SAB      TAB      Ectopic      Multiple      Live Births  4            Home Medications    Prior to Admission medications   Medication Sig Start Date End Date Taking? Authorizing Provider  methocarbamol (ROBAXIN) 500 MG tablet Take 1 tablet (500 mg total) by mouth 3 (three) times daily. X 7 days then prn muscle spasms Patient not taking: Reported on 12/02/2017 08/25/17   Anders Simmonds, PA-C  naproxen (NAPROSYN) 500 MG tablet Take 1 tablet (500 mg total) by mouth 2 (two) times daily with a meal. 01/16/19   Linus Mako B, NP  vitamin E 100 UNIT capsule  Take by mouth daily.    [provider]    Family History Family History  Problem Relation Age of Onset  . Hypertension Mother   . Diabetes Mother   . Hypertension Father   . Uterine cancer Maternal Grandmother     Social History Social History   Tobacco Use  . Smoking status: Never Smoker  . Smokeless tobacco: Never Used  Substance Use Topics  . Alcohol use: No  . Drug use: No     Allergies   Hydrocodone   Review of Systems Review of Systems   Physical Exam Triage Vital Signs ED Triage Vitals [01/16/19 0850]  Enc Vitals Group     BP (!) 148/93     Pulse Rate 74     Resp 17     Temp 98.2 F (36.8 C)     Temp Source Oral     SpO2 98 %     Weight      Height      Head Circumference  Peak Flow      Pain Score 8     Pain Loc      Pain Edu?      Excl. in GC?    No data found.  Updated Vital Signs BP (!) 148/93 (BP Location: Left Arm)   Pulse 74   Temp 98.2 F (36.8 C) (Oral)   Resp 17   LMP 01/09/2019   SpO2 98%   Visual Acuity Right Eye Distance:   Left Eye Distance:   Bilateral Distance:    Right Eye Near:   Left Eye Near:    Bilateral Near:     Physical Exam Constitutional:      General: She is not in acute distress.    Appearance: She is well-developed.  Cardiovascular:     Rate and Rhythm: Normal rate.  Pulmonary:     Effort: Pulmonary effort is normal.  Musculoskeletal:     Left ankle: She exhibits normal range of motion, no swelling, no ecchymosis, no deformity, no laceration and normal pulse. Tenderness. Lateral malleolus tenderness found. Achilles tendon normal.     Left foot: Normal range of motion and normal capillary refill. Tenderness and bony tenderness present. No swelling, crepitus, deformity or laceration.       Feet:     Comments: Left foot with tenderness over 3rd and 4th MTP joints without redness warmth or swelling; pain to lateral malleolus and surrounding soft tissues without redness warmth or  swelling; strong pedal pulse; pain with dorsiflexion noted; cap refill < 2 seconds    Skin:    General: Skin is warm and dry.  Neurological:     Mental Status: She is alert and oriented to person, place, and time.      UC Treatments / Results  Labs (all labs ordered are listed, but only abnormal results are displayed) Labs Reviewed - No data to display  EKG   Radiology Dg Ankle Complete Left  Result Date: 01/16/2019 CLINICAL DATA:  Fall, pain third and fourth MTP joints and lateral malleolus EXAM: LEFT ANKLE COMPLETE - 3+ VIEW; LEFT FOOT - COMPLETE 3+ VIEW COMPARISON:  None. FINDINGS: No fracture or dislocation of the left foot or left ankle with specific attention to the left third and fourth MTP joints and lateral malleolus. The joint spaces are well preserved. The soft tissues are unremarkable. IMPRESSION: No fracture or dislocation of the left foot or left ankle with specific attention to the left third and fourth MTP joints and lateral malleolus. Electronically Signed   By: Lauralyn PrimesAlex  Bibbey M.D.   On: 01/16/2019 09:45   Dg Foot Complete Left  Result Date: 01/16/2019 CLINICAL DATA:  Fall, pain third and fourth MTP joints and lateral malleolus EXAM: LEFT ANKLE COMPLETE - 3+ VIEW; LEFT FOOT - COMPLETE 3+ VIEW COMPARISON:  None. FINDINGS: No fracture or dislocation of the left foot or left ankle with specific attention to the left third and fourth MTP joints and lateral malleolus. The joint spaces are well preserved. The soft tissues are unremarkable. IMPRESSION: No fracture or dislocation of the left foot or left ankle with specific attention to the left third and fourth MTP joints and lateral malleolus. Electronically Signed   By: Lauralyn PrimesAlex  Bibbey M.D.   On: 01/16/2019 09:45    Procedures Procedures (including critical care time)  Medications Ordered in UC Medications - No data to display  Initial Impression / Assessment and Plan / UC Course  I have reviewed the triage vital signs and  the nursing  notes.  Pertinent labs & imaging results that were available during my care of the patient were reviewed by me and considered in my medical decision making (see chart for details).     xrays without acute findings. Consistent with sprain. Ice, elevation, nsaids, ace/ brace recommended. Exercises provided. Follow up with PCP and/or sports medicine as needed. Patient verbalized understanding and agreeable to plan.    Final Clinical Impressions(s) / UC Diagnoses   Final diagnoses:  Sprain of left ankle, unspecified ligament, initial encounter     Discharge Instructions      su radiografa no muestra ningn hallazgo de huesos rotos. naproxeno [employer] al da, tomar con alimentos. esto ayudar con el dolor. actividad segn la tolerancia. 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 levante el pie y aplique hielo al final del da. ver ejercicios proporcionados para fortalecer su tobillo  your xray does not show any findings of broken bones.  naproxen twice a day, take with food. this will help with pain.  activity as tolerated.  may use your wrap as well as the brace provided.  elevate your foot and apply ice at the end of your day.  see exercises provided to strengthen your ankle    ED Prescriptions    Medication Sig Dispense Auth. Provider   naproxen (NAPROSYN) 500 MG tablet Take 1 tablet (500 mg total) by mouth 2 (two) times daily with a meal. 30 tablet Zigmund Gottron, NP     PDMP not reviewed this encounter.   Augusto Gamble B, NP 01/16/19 1000

## 2019-02-14 ENCOUNTER — Ambulatory Visit
Admission: RE | Admit: 2019-02-14 | Discharge: 2019-02-14 | Disposition: A | Discharge status: Home / Self Care | Payer: No Typology Code available for payment source | Source: Ambulatory Visit | Attending: Obstetrics and Gynecology | Admitting: Obstetrics and Gynecology

## 2019-02-14 ENCOUNTER — Ambulatory Visit (HOSPITAL_COMMUNITY)
Admission: RE | Admit: 2019-02-14 | Discharge: 2019-02-14 | Disposition: A | Discharge status: Home / Self Care | Payer: Self-pay | Source: Ambulatory Visit | Attending: Obstetrics and Gynecology | Admitting: Obstetrics and Gynecology

## 2019-02-14 ENCOUNTER — Ambulatory Visit: Payer: Self-pay

## 2019-02-14 ENCOUNTER — Encounter (HOSPITAL_COMMUNITY): Payer: Self-pay

## 2019-02-14 ENCOUNTER — Other Ambulatory Visit: Payer: Self-pay

## 2019-02-14 DIAGNOSIS — Z1239 Encounter for other screening for malignant neoplasm of breast: Secondary | ICD-10-CM | POA: Insufficient documentation

## 2019-02-14 DIAGNOSIS — N644 Mastodynia: Secondary | ICD-10-CM | POA: Insufficient documentation

## 2019-02-14 NOTE — Progress Notes (Signed)
Complaints of bilateral breast pain that starts within the right breast and radiates to the left x 3 months. Patient states the pain comes and goes. Patient rates the pain at a 6 out of 10.  Pap Smear: Pap smear not completed today. Last Pap smear was 12/02/2017 at [facility] and normal with negative HPV. Patients previous Pap smear 10/19/2016 was normal with positive HPV. Per patient has no history of an abnormal Pap smear. ASCCP guidelines recommend a Pap smear in three years. Last two Pap smear results are in Epic.  Physical exam: Breasts Breasts symmetrical. No skin abnormalities bilateral breasts. No nipple retraction bilateral breasts. No nipple discharge bilateral breasts. No lymphadenopathy. No lumps palpated bilateral breasts. Complaints of right outer breast pain on exam. Referred patient to the [facility] for a diagnostic mammogram. Appointment scheduled for Tuesday, February 14, 2019 at 1010.        Pelvic/Bimanual No Pap smear completed today since last Pap smear and HPV typing was 12/02/2017. Pap smear not indicated per BCCCP guidelines.   Smoking History: Patient has never smoked.  Patient Navigation: Patient education provided. Access to services provided for patient through [facility] program. Spanish interpreter provided.   Breast and Cervical Cancer Risk Assessment: Patient has no family history of breast cancer, known genetic mutations, or radiation treatment to the chest before age 57. Patient has no history of cervical dysplasia, immunocompromised, or DES exposure in-utero.  Risk Assessment    Risk Scores      02/14/2019 12/02/2017   Last edited by: Loletta Parish, RN Kathleena Freeman, Heath Gold, RN   5-year risk: 0.3 % 0.3 %   Lifetime risk: 5.8 % 5.8 %         Used Spanish interpreter Rudene Anda from [place].

## 2019-02-14 NOTE — Patient Instructions (Signed)
Explained breast self awareness with [facility]. Patient did not need a Pap smear today due to last Pap smear was 12/02/2017. Let her know BCCCP will cover Pap smears every 3 years unless has a history of abnormal Pap smears. Referred patient to the [facility] for a diagnostic mammogram. Appointment scheduled for Tuesday, February 14, 2019 at 1010. Patient aware of appointment and will be there. [place] verbalized understanding.  Kishan Wachsmuth, Arvil Chaco, RN 9:32 AM

## 2019-03-27 ENCOUNTER — Ambulatory Visit: Payer: Self-pay | Attending: Internal Medicine | Admitting: Internal Medicine

## 2019-03-27 ENCOUNTER — Ambulatory Visit: Payer: Self-pay

## 2019-03-27 ENCOUNTER — Other Ambulatory Visit: Payer: Self-pay

## 2019-03-27 DIAGNOSIS — N898 Other specified noninflammatory disorders of vagina: Secondary | ICD-10-CM

## 2019-03-27 DIAGNOSIS — R300 Dysuria: Secondary | ICD-10-CM

## 2019-03-27 MED ORDER — FLUCONAZOLE 150 MG PO TABS: 150.0000 mg | ORAL_TABLET | Freq: Once | ORAL | 0 refills | Status: AC

## 2019-03-27 MED ORDER — SULFAMETHOXAZOLE-TRIMETHOPRIM 400-80 MG PO TABS: 1.0000 | ORAL_TABLET | Freq: Two times a day (BID) | ORAL | 0 refills | Status: DC

## 2019-03-27 MED FILL — SULFAMETHOXAZOLE-TMP DS TAB: 800-160 | 3 days supply | Qty: 6 | Fill #0

## 2019-03-27 MED FILL — FLUCONAZOLE 150 MG TABLET: 150 | 1 days supply | Qty: 1 | Fill #0

## 2019-03-27 NOTE — Progress Notes (Signed)
Virtual Visit via Telephone Note Due to current restrictions/limitations of in-office visits due to the COVID-19 pandemic, this scheduled clinical appointment was converted to a telehealth visit  I connected with Tracy Blevins on 03/27/19 at 1:43 p.m by telephone and verified that I am speaking with the correct person using two identifiers. I am in my office.  The patient is at home.  Only the patient, myself and Franchot Erichsen [phone]) from [employer] participated in this encounter.  I discussed the limitations, risks, security and privacy concerns of performing an evaluation and management service by telephone and the availability of in person appointments. I also discussed with the patient that there may be a patient responsible charge related to this service. The patient expressed understanding and agreed to proceed.   History of Present Illness: Pt c/o having bladder infection.  C/o little burning with urination and vaginal itching x 3 days No vaginal dischg No fever. Tried Azo OTC which helped a little   Observations/Objective:   Assessment and Plan: 1. Dysuria We will treat empirically with Bactrim. - Urinalysis - Urinalysis, Routine w reflex microscopic - sulfamethoxazole-trimethoprim (BACTRIM) 400-80 MG tablet; Take 1 tablet by mouth 2 (two) times daily.  Dispense: 6 tablet; Refill: 0  2. Vagina itching - fluconazole (DIFLUCAN) 150 MG tablet; Take 1 tablet (150 mg total) by mouth once for 1 dose.  Dispense: 1 tablet; Refill: 0   Follow Up Instructions: Follow-up as needed.   I discussed the assessment and treatment plan with the patient. The patient was provided an opportunity to ask questions and all were answered. The patient agreed with the plan and demonstrated an understanding of the instructions.   The patient was advised to call back or seek an in-person evaluation if the symptoms worsen or if the condition fails to improve as anticipated.  I  provided 10 minutes of non-face-to-face time during this encounter.   Karle Plumber, MD

## 2019-03-27 NOTE — Progress Notes (Signed)
Pt states she is having discomfort urinating  Pt states she bought some otc medicine and it has not helped

## 2019-03-28 LAB — MICROSCOPIC EXAMINATION
Casts: NONE SEEN /lpf
WBC, UA: >30 /hpf — AB (ref 0–5)

## 2019-03-28 LAB — URINALYSIS, ROUTINE W REFLEX MICROSCOPIC
Bilirubin, UA: NEGATIVE
Glucose, UA: NEGATIVE
Ketones, UA: NEGATIVE
Nitrite, UA: POSITIVE — AB
RBC, UA: NEGATIVE
Specific Gravity, UA: 1.022 (ref 1.005–1.030)
Urobilinogen, Ur: 1 mg/dL (ref 0.2–1.0)
pH, UA: 6.5 (ref 5.0–7.5)

## 2019-05-23 ENCOUNTER — Encounter (HOSPITAL_COMMUNITY): Payer: Self-pay

## 2019-06-22 IMAGING — DX DG LUMBAR SPINE COMPLETE 4+V
5 series · 5 of 5 positions shown · non-contrast
Comparison: CT 01/23/2011

CLINICAL DATA: Low back pain

EXAM:
LUMBAR SPINE - COMPLETE 4+ VIEW

[l-spine ap]
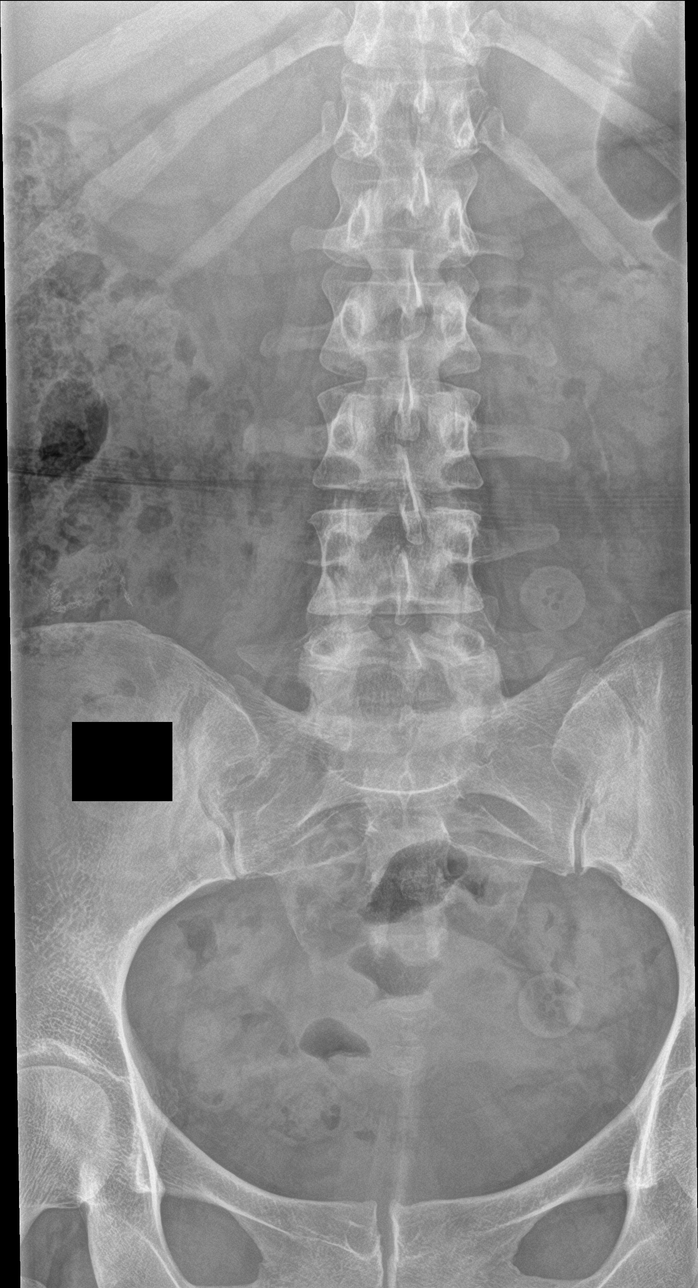

[l-spine obl (1 of 2)]
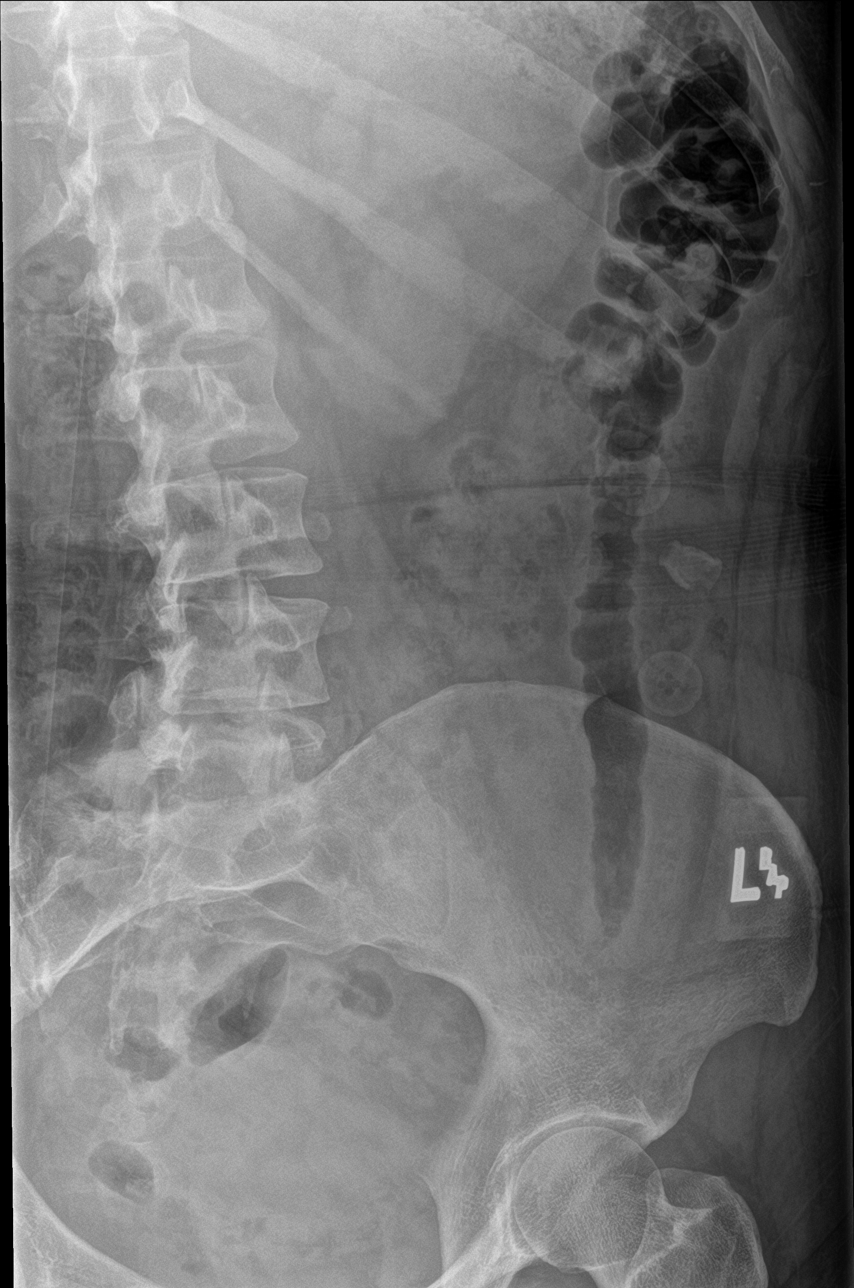

[l-spine lat]
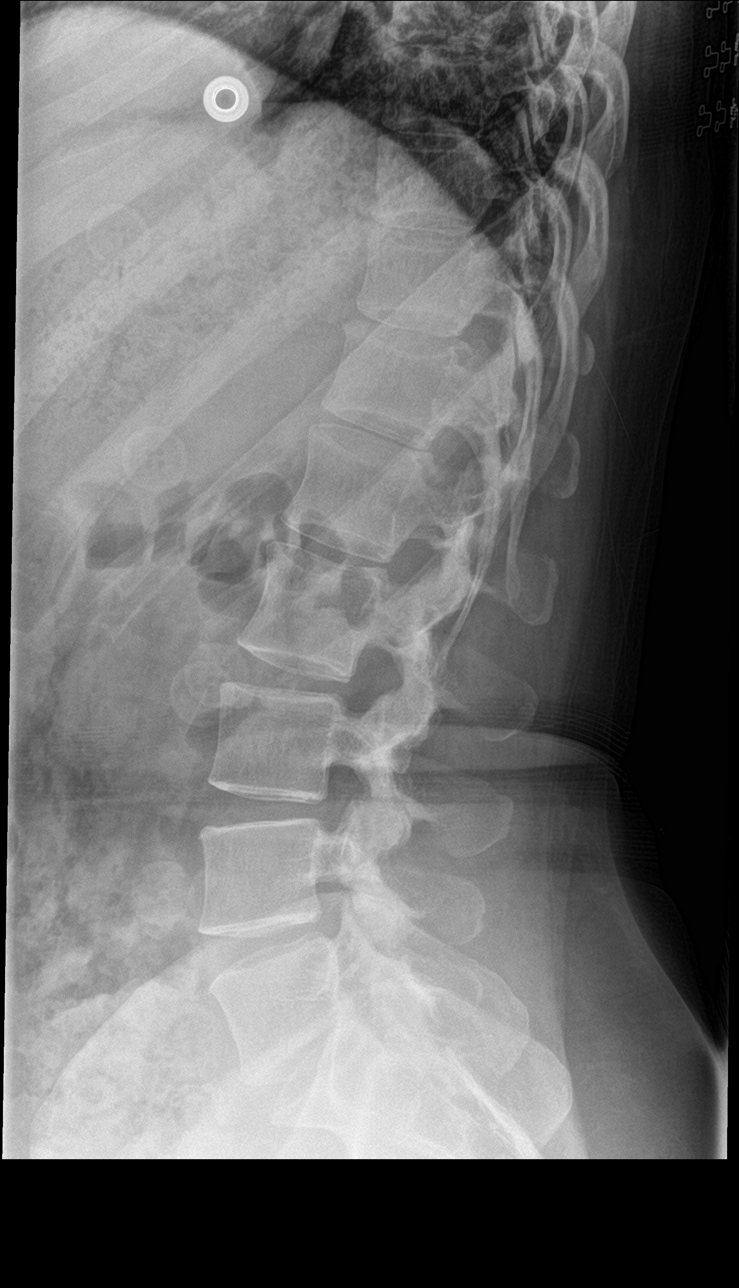

[l-spine spot]
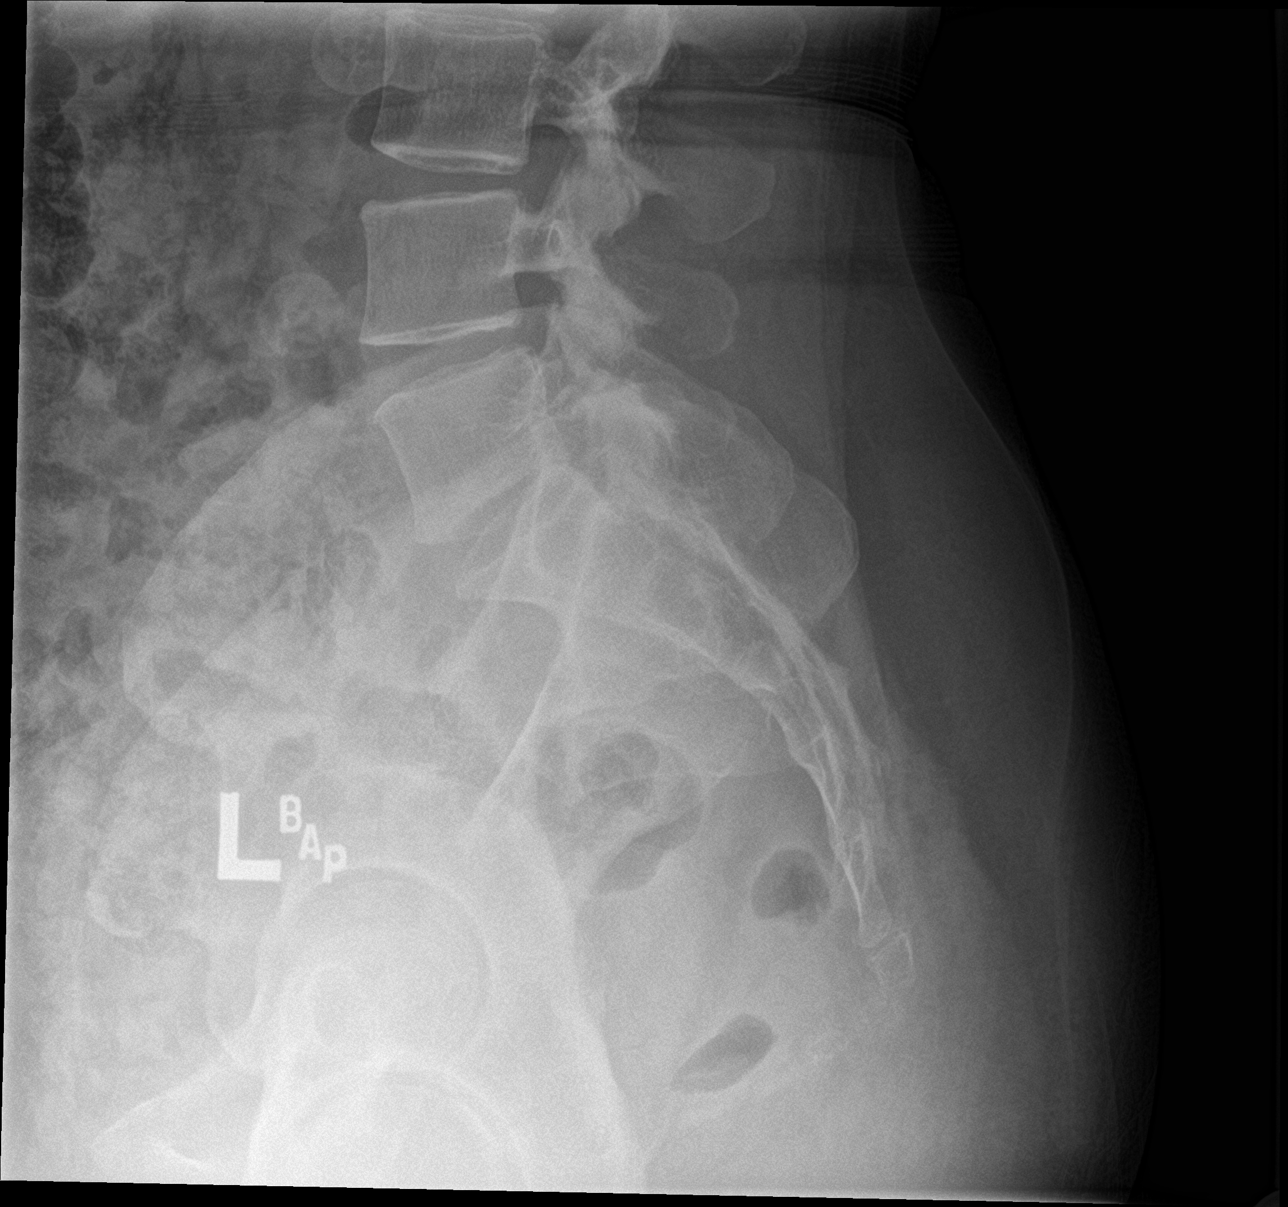

[l-spine obl (2 of 2)]
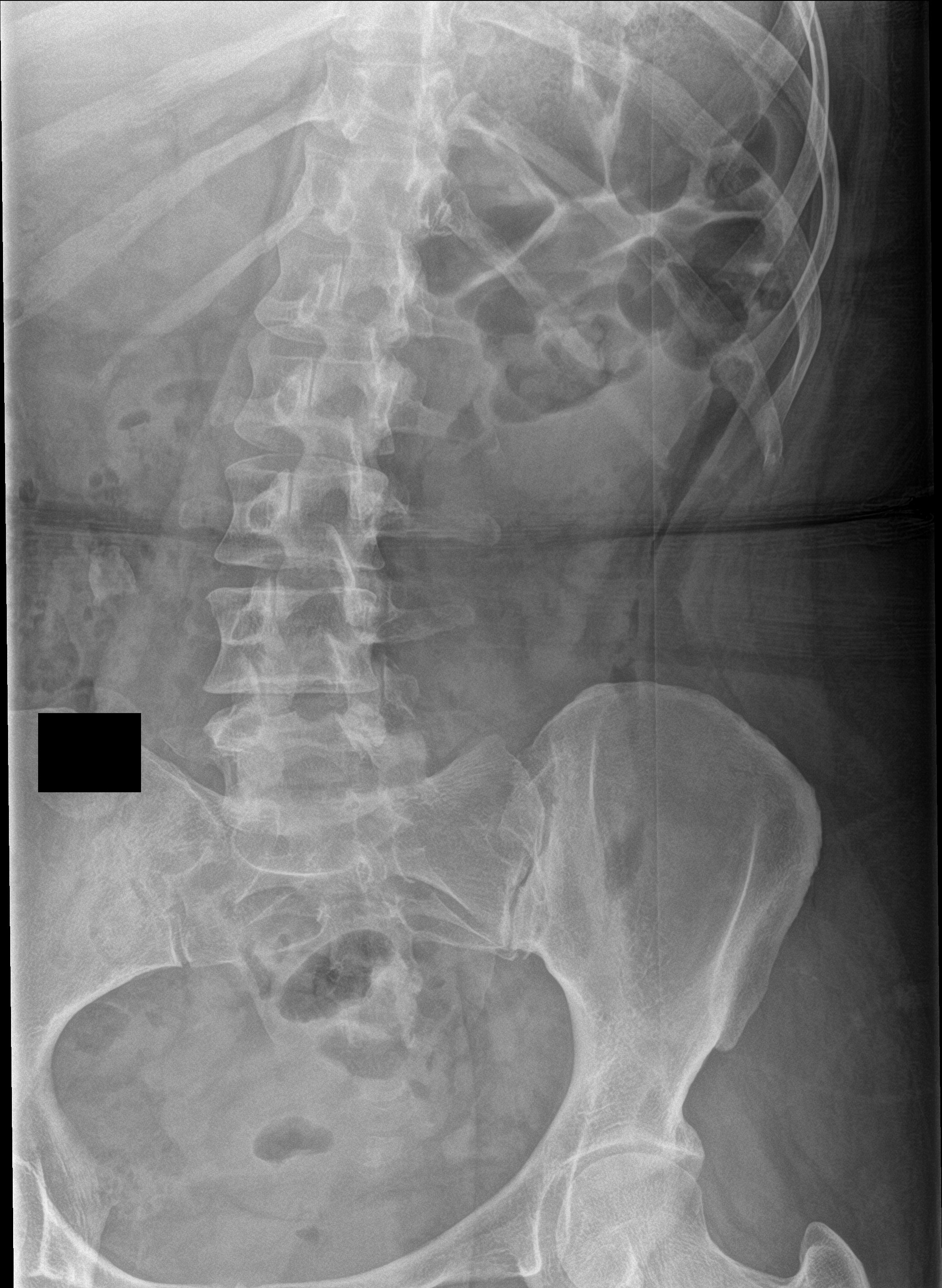

[5 of 5 positions shown; findings below may reference images not displayed]

FINDINGS: Button artifacts over the left paraspinal region. Five non
rib-bearing lumbar type vertebra. Lumbar alignment is within normal
limits. The vertebral body heights are normal. Disc spaces are
preserved.
IMPRESSION: Negative.

## 2020-07-18 ENCOUNTER — Other Ambulatory Visit: Payer: Self-pay | Admitting: Internal Medicine

## 2020-07-18 ENCOUNTER — Ambulatory Visit: Payer: Self-pay | Attending: Internal Medicine | Admitting: Internal Medicine

## 2020-07-18 ENCOUNTER — Encounter: Payer: Self-pay | Admitting: Internal Medicine

## 2020-07-18 ENCOUNTER — Other Ambulatory Visit: Payer: Self-pay

## 2020-07-18 VITALS — BP 123/84 | HR 63 | Resp 16 | Ht 66.0 in | Wt 178.8 lb

## 2020-07-18 DIAGNOSIS — H8103 Meniere's disease, bilateral: Secondary | ICD-10-CM

## 2020-07-18 DIAGNOSIS — Z23 Encounter for immunization: Secondary | ICD-10-CM | POA: Insufficient documentation

## 2020-07-18 MED ORDER — MECLIZINE HCL 12.5 MG PO TABS: 12.5000 mg | ORAL_TABLET | Freq: Two times a day (BID) | ORAL | 0 refills | Status: DC | PRN

## 2020-07-18 NOTE — Progress Notes (Signed)
Patient ID: Tracy Blevins, female    DOB: 16-Apr-1976  MRN: [id number]  CC:  Problems with ears  Subjective: Tracy Blevins is a 45 y.o. female who presents for issue with ears Her concerns today include:   Pt c/o feeling like ears  get block intermittent.  Can last a whole day then goes away and may not return a mth later.  Occurred 3 times this year and several times last year Episodes associated with dizziness and dec hearing Does not use objects in ears to clean them No headaches. Reports some itchy eyes and allergy symptoms at times.  Patient Active Problem List   Diagnosis Date Noted  . Influenza vaccine needed 07/18/2020  . Need for tetanus, diphtheria, and acellular pertussis (Tdap) vaccine 07/18/2020  . Screening breast examination 02/14/2019  . Breast pain, right 02/14/2019     Current Outpatient Medications on File Prior to Visit  Medication Sig Dispense Refill  . sulfamethoxazole-trimethoprim (BACTRIM) 400-80 MG tablet Take 1 tablet by mouth 2 (two) times daily. (Patient not taking: Reported on 07/18/2020) 6 tablet 0  . vitamin E 100 UNIT capsule Take by mouth daily. (Patient not taking: Reported on 07/18/2020)     Current Facility-Administered Medications on File Prior to Visit  Medication Dose Route Frequency Provider Last Rate Last Admin  . 0.9 %  sodium chloride infusion  500 mL Intravenous Once Meryl Dare, MD        Allergies  Allergen Reactions  . Hydrocodone Nausea And Vomiting and Other (See Comments)    Dizziness     Social History   Socioeconomic History  . Marital status: Married    Spouse name: Not on file  . Number of children: 4  . Years of education: Not on file  . Highest education level: Not on file  Occupational History  . Not on file  Tobacco Use  . Smoking status: Never Smoker  . Smokeless tobacco: Never Used  Vaping Use  . Vaping Use: Never used  Substance and Sexual Activity  . Alcohol use: No  . Drug use: No   . Sexual activity: Yes    Birth control/protection: Surgical  Other Topics Concern  . Not on file  Social History Narrative  . Not on file   Social Determinants of Health   Financial Resource Strain: Not on file  Food Insecurity: Not on file  Transportation Needs: Not on file  Physical Activity: Not on file  Stress: Not on file  Social Connections: Not on file  Intimate Partner Violence: Not on file    Family History  Problem Relation Age of Onset  . Hypertension Mother   . Diabetes Mother   . Hypertension Father   . Uterine cancer Maternal Grandmother     Past Surgical History:  Procedure Laterality Date  . APPENDECTOMY  2007  . CESAREAN SECTION  2000  . CESAREAN SECTION  2008  . TUBAL LIGATION  2008    ROS: Review of Systems Negative except as stated above  PHYSICAL EXAM: BP 123/84   Pulse 63   Resp 16   Ht 5\' 6"  (1.676 m)   Wt 178 lb 12.8 oz (81.1 kg)   SpO2 100%   BMI 28.86 kg/m   Physical Exam  General appearance - alert, well appearing, middle-aged Hispanic female and in no distress Mental status - normal mood, behavior, speech, dress, motor activity, and thought processes Ears - bilateral TM's and external ear canals normal Chest -  clear to auscultation, no wheezes, rales or rhonchi, symmetric air entry Heart - normal rate, regular rhythm, normal S1, S2, no murmurs, rubs, clicks or gallops   CMP Latest Ref Rng & Units 07/22/2017 06/18/2016 01/23/2011  Glucose 65 - 99 mg/dL 79 92 237(S)  BUN 6 - 24 mg/dL 14 14 13   Creatinine 0.57 - 1.00 mg/dL 2.83 1.51  Sodium 134 - 144 mmol/L 142 138 135  Potassium 3.5 - 5.2 mmol/L 3.9 3.6 3.2(L)  Chloride 96 - 106 mmol/L 104 103 101  CO2 20 - 29 mmol/L 23 23 24   Calcium 8.7 - 10.2 mg/dL 8.7 9.5 9.0  Total Protein 6.0 - 8.5 g/dL 6.6 - 7.6  Total Bilirubin 0.0 - 1.2 mg/dL 0.3 - 0.7  Alkaline Phos 39 - 117 IU/L 48 - 59  AST 0 - 40 IU/L 14 - 30  ALT 0 - 32 IU/L 14 - 40(H)   Lipid Panel  No results found  for: CHOL, TRIG, HDL, CHOLHDL, VLDL, LDLCALC, LDLDIRECT  CBC    Component Value Date/Time   WBC 6.9 07/22/2017 1523   WBC 7.6 06/18/2016 2204   RBC 3.85 07/22/2017 1523   RBC 4.05 06/18/2016 2204   HGB 10.7 (L) 07/22/2017 1523   HCT 34.0 07/22/2017 1523   PLT 343 07/22/2017 1523   MCV 88 07/22/2017 1523   MCH 27.8 07/22/2017 1523   MCH 27.7 06/18/2016 2204   MCHC 31.5 07/22/2017 1523   MCHC 32.9 06/18/2016 2204   RDW 14.4 07/22/2017 1523   LYMPHSABS 2.2 07/22/2017 1523   MONOABS 1.2 (H) 01/23/2011 1904   EOSABS 0.2 07/22/2017 1523   BASOSABS 0.0 07/22/2017 1523    ASSESSMENT AND PLAN: 1. Meniere disease, bilateral I think her symptoms are most consistent with Mnire's disease.  I explained this diagnosis to her.  Causes can include acute viral respiratory illnesses, head injury, autoimmune diseases. Advised to limit salt in the foods as much as possible, avoid caffeinated beverages and excess alcohol use.  Use Claritin over-the-counter for allergy symptoms.  I have given her some meclizine to use as needed when she has episodes.  If episodes start occurring more frequently we can refer her to ENT - meclizine (ANTIVERT) 12.5 MG tablet; Take 1 tablet (12.5 mg total) by mouth 2 (two) times daily as needed for dizziness.  Dispense: 30 tablet; Refill: 0  2. Influenza vaccine needed Given  3. Need for tetanus, diphtheria, and acellular pertussis (Tdap) vaccine Given     Patient was given the opportunity to ask questions.  Patient verbalized understanding of the plan and was able to repeat key elements of the plan.  [employer] Language interpreter used during this encounter. 09/21/2017  No orders of the defined types were placed in this encounter.    Requested Prescriptions    No prescriptions requested or ordered in this encounter    No follow-ups on file.  09/21/2017, MD, FACP

## 2020-07-18 NOTE — Patient Instructions (Addendum)
Enfermedad de Mnire Mnire's Disease  La enfermedad de Mnire es un trastorno del odo interno. Causa crisis caracterizadas por una sensacin de dar vueltas (vrtigo) y zumbidos en los odos (tinnitus). Tambin causa prdida de la audicin y la sensacin de presin o de tener el odo tapado. Por lo general, afecta un odo, pero puede afectar ambos. Es una afeccin crnica y puede empeorar con [place]. Hay opciones de tratamiento para ayudar a [employer] de la enfermedad de [place]. Cules son las causas? La causa de esta afeccin es el exceso de lquido que se encuentra en el odo interno (endolinfa). Cuando el lquido se acumula en el odo interno, afecta los nervios que controlan el equilibrio y la audicin. Se desconoce la causa de la acumulacin del lquido. Las causas posibles son las siguientes:  Environmental consultant.  Reaccin anormal del sistema de defensa del cuerpo (enfermedad autoinmunitaria).  Infeccin viral del odo interno.  Lesiones en la cabeza. Qu incrementa el riesgo? Los siguientes factores pueden hacer que sea ms propenso a desarrollar esta afeccin:  Tener entre 40 y 76 aos.  Tener antecedentes familiares de enfermedad de Mnire.  Tener antecedentes de una enfermedad autoinmunitaria.  Sufrir una lesin en la cabeza (traumatismo de crneo). Cules son los signos o sntomas? Los sntomas de esta afeccin incluyen:  Presin en un odo y sensacin de que est tapado.  Crepitaciones o zumbidos en el odo (tinnitus).  Vrtigo y prdida del equilibrio.  Disminucin de la audicin.  Nuseas y vmitos. Esto ocurre slo a veces. Los sntomas de esta afeccin pueden ir y Research scientist (physical sciences), y pueden durar hasta 4 o ms horas por vez. Generalmente, los sntomas se presentan en un odo. Con el tiempo, pueden ser ms frecuentes y Audiological scientist ambos odos. Cmo se diagnostica? Esta enfermedad se diagnostica en funcin de un examen fsico y estudios. Los estudios pueden  [employer] siguientes:  Neomia Dear prueba de audicin [place]).  Una electronistagmografa (ENG). Este es un estudio del nervio asociado con el equilibrio (nervio vestibular).  Estudios de diagnstico por imgenes del odo interno y el nervio [place], como una resonancia magntica (RM).  Otros estudios del equilibrio, tales como pruebas en plataformas de equilibrio o giratorias. Cmo se trata? No hay cura para esta afeccin, pero el tratamiento puede ayudar a [employer] sntomas. El tratamiento puede incluir:  Una dieta baja en sal (con bajo contenido de sodio). Esto puede ayudar a disminuir la cantidad de lquido en el cuerpo y [employer] sntomas.  Medicamentos inyectables o que se administran por va oral para reducir o controlar lo siguiente: ? Vrtigo. ? Nuseas. ? Retencin de lquidos.  Uso de un generador de impulsos de presin de aire. Esta mquina enva pulsos de baja presin al canal auditivo.  Audfonos.  Ciruga del odo interno. Esto es poco frecuente. Siga estas instrucciones en su casa: Comida y bebida  Evite la cafena.  No beba alcohol.  Beba suficiente lquido como para Pharmacologist la orina de color amarillo plido.  Limite el sodio en su dieta segn las indicaciones del mdico. Por lo general, esto no es de ms de 1500 a 2000mg  por . Controle los ingredientes y la informacin nutricional de las bebidas y los alimentos envasados. Instrucciones generales  Futures trader de venta libre y los recetados solamente como se lo haya indicado el mdico.  No conduzca si tiene vrtigo o mareos.  No consuma ningn producto que contenga nicotina o tabaco, como cigarrillos, cigarrillos electrnicos y tabaco de [employer].  Si necesita ayuda para dejar de consumir estos productos, consulte al mdico.  Concurra a todas las visitas de seguimiento como se lo haya indicado el mdico. Esto es importante. Comunquese con un mdico si:  Tiene sntomas que duran ms de  4horas.  Tiene sntomas nuevos o estos empeoran. Solicite ayuda de inmediato si:  Ha vomitado durante 24horas.  No puede retener los lquidos.  Tiene dolor en el pecho o dificultad para respirar. Estos sntomas pueden representar un problema grave que constituye Radio broadcast assistantuna emergencia. No espere a ver si los sntomas desaparecen. Solicite atencin mdica de inmediato. Comunquese con el servicio de emergencias de su localidad (911 en los [place]). No conduzca por sus propios medios OfficeMax Incorporatedhasta el hospital. Resumen  La enfermedad de Mnire es un trastorno del odo interno.  Esta afeccin causa crisis caracterizadas por una sensacin de dar vueltas (vrtigo), zumbidos en los odos (tinnitus), prdida de la audicin y sensacin de que el odo est lleno.  Los sntomas de esta afeccin pueden ir y Research scientist (physical sciences)venir, y pueden durar hasta 4 o ms horas por vez.  La enfermedad de Mnire puede tratarse con una dieta con bajo contenido de sodio, medicamentos y, en algunos [place], Azerbaijanciruga. Esta informacin no tiene Theme park managercomo fin reemplazar el consejo del mdico. Asegrese de hacerle al mdico cualquier pregunta que tenga. Document Revised: 04/20/2019 Document Reviewed: 04/20/2019 Elsevier Patient Education  2021 Elsevier Inc. Madilyn FiremanVacuna Td (ttanos y difteria): lo que debe saber Td (Tetanus, Diphtheria) Vaccine: What You Need to Know 1. Por qu vacunarse? La vacuna Td puede prevenir el ttanos y la difteria. El ttanos ingresa al organismo a travs de cortes o heridas. La difteria se contagia de persona a Social workerpersona.  El GrafTANOS (T) provoca rigidez dolorosa en los msculos. El ttanos puede causar graves problemas de DeFuniak Springssalud, como no poder abrir la boca, Warehouse managertener dificultad para tragar y Industrial/product designerrespirar, o la muerte.  La DIFTERIA (D) puede causar dificultad para respirar, insuficiencia cardaca, parlisis o muerte. 2. Madilyn FiremanVacuna Td La vacuna Td es solo para nios de 7 aos en adelante, adolescentes y [place] Junctionadultos.  La vacuna Td se  administra normalmente como dosis de refuerzo cada 10aos, o despus de 5aos en caso de sufrir una quemadura o herida grave o sucia. En lugar de la vacuna Td, se puede utilizar otra vacuna llamada "Tdap". La vacuna Tdap, adems del ttanos y la difteria, protege contra la tos Jansenferina, tambin conocida como "tos convulsa". La vacuna Td puede aplicarse al mismo tiempo que otras vacunas. 3. Hable con el mdico Comunquese con la persona que le coloca las vacunas si la persona que la recibe:  Ha tenido una reaccin alrgica despus de Neomia Dearuna dosis anterior de cualquier vacuna contra el ttanos o la difteria, o cualquier alergia grave, potencialmente mortal  Alguna vez tuvo sndrome de Pension scheme managerGuillain-Barr (tambin llamado "SGB")  Ha tenido dolor intenso o hinchazn despus de una dosis anterior de cualquier vacuna contra el ttanos o la difteria En algunos casos, es posible que el mdico decida posponer la aplicacin de la vacuna Td para una visita en el futuro. Las personas que sufren trastornos menores, como un resfro, pueden vacunarse. Las Eli Lilly and Companypersonas que tienen enfermedades moderadas o graves generalmente deben esperar hasta recuperarse para poder recibir la vacunaTd.  Su mdico puede darle ms informacin. 4. Riesgos de [place]a reaccin a la vacuna  Despus de recibir la vacuna Td a veces se puede Surveyor, miningtener dolor, enrojecimiento o Paramedichinchazn en el lugar donde se aplic la inyeccin, fiebre leve, dolor de [place]ron[place]acabeza,  sensacin de cansancio y nuseas, vmitos, diarrea o dolor de [place]. Las personas a veces se desmayan despus de procedimientos mdicos, incluida la vacunacin. Informe al mdico si se siente mareado, tiene cambios en la visin o zumbidos en los odos.  Al igual que con cualquier Automatic Data, existe una probabilidad muy remota de que una vacuna cause una reaccin alrgica grave, otra lesin grave o la muerte. 5. Qu pasa si se presenta un problema grave? Podra producirse una reaccin alrgica  despus de que la persona vacunada abandone la clnica. Si observa signos de Runner, broadcasting/film/video grave (ronchas, hinchazn de la cara y la garganta, dificultad para respirar, latidos cardacos acelerados, mareos o debilidad), llame al 9-1-1 y lleve a la persona al hospital ms cercano.  Si se presentan otros signos que le preocupan, comunquese con su mdico.  Las reacciones adversas deben informarse al Sistema de Informe de Eventos Adversos de Administrator, arts (VAERS). Por lo general, el mdico presenta este informe o puede hacerlo usted mismo. Visite el sitio web del VAERS en www.vaers.LAgents.no o llame al (540)241-8098. El VAERS es solo para Biomedical engineer, y los miembros de su personal no proporcionan asesoramiento mdico. 6. Programa Nacional de Compensacin de Daos por [employer] [place] de Compensacin de Daos por Administrator, arts (VICP) es un programa federal que fue creado para compensar a las personas que puedan haber sufrido daos al recibir ciertas vacunas. Las Investment banker, operational a presuntas lesiones o muerte debidas a la vacunacin tienen un lmite de tiempo para su presentacin, que puede ser de tan solo [employer]. Visite el sitio web del VICP en SpiritualWord.at o llame al [phone] para obtener ms informacin acerca del programa y de cmo presentar un reclamo. 7. Cmo puedo obtener ms informacin?  Pregntele a su mdico.  Comunquese con el servicio de salud de su localidad o su estado.  Visite el sitio web de Tax inspector) (Administracin de Alimentos y Media planner) para ver los prospectos de las vacunas e informacin adicional en [email or web address].  Comunquese con Building control surveyor for [employer]) (Centros para el Control y la Prevencin de [place]): ? Llame al [phone] (1-800-CDC-INFO) o ? Visite el sitio Environmental manager en [email or web address]. Declaracin de informacin  de la vacuna Td (ttanos y difteria) (11/24/2019) Esta informacin no tiene Theme park manager el consejo del mdico. Asegrese de hacerle al mdico cualquier pregunta que tenga. Document Revised: 01/23/2020 Document Reviewed: 01/20/2020 Elsevier Patient Education  2021 Elsevier Inc.   Influenza Virus Vaccine injection (Fluarix) Qu es este medicamento? La VACUNA ANTIGRIPAL ayuda a disminuir el riesgo de contraer la influenza, tambin conocida como la gripe. La vacuna solo ayuda a protegerle contra algunas cepas de influenza. Esta vacuna no ayuda a reducir Nurse, adult de contraer influenza pandmica H1N1. Este medicamento puede ser utilizado para otros usos; si tiene alguna pregunta consulte con su proveedor de atencin mdica o con su farmacutico. MARCAS COMUNES: Fluarix, Fluzone Qu le debo informar a mi profesional de la salud antes de tomar este medicamento? Necesita saber si usted presenta alguno de los siguientes problemas o situaciones:  trastorno de sangrado como hemofilia  fiebre o infeccin  sndrome de Guillain-Barre u otros problemas neurolgicos  problemas del sistema inmunolgico  infeccin por el virus de la inmunodeficiencia humana (VIH) o SIDA  niveles bajos de plaquetas en la sangre  esclerosis mltiple  una reaccin Counselling psychologist o inusual a las vacunas antigripales, a los huevos, protenas de pollo, al ltex, a la gentamicina,  a otros medicamentos, alimentos, colorantes o conservantes  si est embarazada o buscando quedar embarazada  si est amamantando a un beb Cmo debo utilizar este medicamento? Esta vacuna se administra mediante inyeccin por va intramuscular. Lo administra un profesional de [employer]. Recibir una copia de informacin escrita sobre la vacuna antes de cada vacuna. Asegrese de leer este folleto cada vez cuidadosamente. Este folleto puede cambiar con frecuencia. Hable con su pediatra para informarse acerca del uso de este medicamento en nios.  Puede requerir atencin especial. Sobredosis: Pngase en contacto inmediatamente con un centro toxicolgico o una sala de urgencia si usted cree que haya tomado demasiado medicamento. ATENCIN: [employer] American es solo para usted. No comparta este medicamento con nadie. Qu sucede si me olvido de una dosis? No se aplica en este caso. Qu puede interactuar con este medicamento?  quimioterapia o radioterapia  medicamentos que suprimen el sistema inmunolgico, tales como etanercept, anakinra, infliximab y adalimumab  medicamentos que tratan o previenen cogulos sanguneos, como warfarina  fenitona  medicamentos esteroideos, como la prednisona o la cortisona  teofilina  vacunas Puede ser que esta lista no menciona todas las posibles interacciones. Informe a su profesional de [employer] de [employer] productos a base de hierbas, medicamentos de [place] o suplementos nutritivos que est tomando. Si usted fuma, consume bebidas alcohlicas o si utiliza drogas ilegales, indqueselo tambin a su profesional de [employer]. Algunas sustancias pueden interactuar con su medicamento. A qu debo estar atento al usar PPL Corporation? Informe a su mdico o a Producer, television/film/video de la [employer] todos los efectos secundarios que persistan despus de [address]. Llame a su proveedor de atencin mdica si se presentan sntomas inusuales dentro de las 6 semanas posteriores a la vacunacin. Es posible que todava pueda contraer la gripe, pero la enfermedad no ser tan fuerte como normalmente. No puede contraer la gripe de esta vacuna. La vacuna antigripal no le protege contra resfros u otras enfermedades que pueden causar [place]. Debe vacunarse cada ao. Qu efectos secundarios puedo tener al [employer] Scientific este medicamento? Efectos secundarios que debe informar a su mdico o a Producer, television/film/video de la salud tan pronto como sea posible:  Therapist, art como erupcin cutnea, picazn o urticarias, hinchazn de la cara,  labios o lengua Efectos secundarios que, por lo general, no requieren atencin mdica (debe informarlos a su mdico o a su profesional de la salud si persisten o si son molestos):  fiebre  dolor de cabeza  molestias y Art gallery manager, sensibilidad, enrojecimiento o Paramedic de la inyeccin  cansancio o debilidad Puede ser que esta lista no menciona todos los posibles efectos secundarios. Comunquese a su mdico por asesoramiento mdico [employer]. Usted puede informar los efectos secundarios a la FDA por telfono al 1-800-FDA-1088. Dnde debo guardar mi medicina? Esta vacuna se administra solamente en clnicas, farmacias, consultorio mdico u otro consultorio de un profesional de la salud y no Teacher, early years/pre en su domicilio. ATENCIN: Este folleto es un resumen. Puede ser que no cubra toda la posible informacin. Si usted tiene preguntas acerca de esta medicina, consulte con su mdico, su farmacutico o su profesional de Radiographer, therapeutic.  2021 Elsevier/Gold Standard (2009-10-08 15:31:40)

## 2020-07-23 ENCOUNTER — Ambulatory Visit: Payer: No Typology Code available for payment source

## 2020-07-24 ENCOUNTER — Ambulatory Visit: Payer: Self-pay | Attending: Family Medicine

## 2020-07-24 ENCOUNTER — Other Ambulatory Visit: Payer: Self-pay

## 2020-08-06 ENCOUNTER — Telehealth: Payer: Self-pay

## 2020-08-06 NOTE — Telephone Encounter (Signed)
Pt was sent a letter from financial dept. Inform them, that the application they submitted was incomplete, since they were missing some documentation at the time of the appointment, Pt need to reschedule and resubmit all new papers and application for CAFA and [place], P.S. old documents has been sent back by mail to the Pt and Pt. need to make a new appt. 

## 2020-09-09 ENCOUNTER — Ambulatory Visit: Payer: No Typology Code available for payment source

## 2020-09-11 ENCOUNTER — Ambulatory Visit: Payer: No Typology Code available for payment source

## 2020-10-25 ENCOUNTER — Other Ambulatory Visit: Payer: Self-pay

## 2020-10-25 ENCOUNTER — Ambulatory Visit: Payer: Self-pay | Attending: Internal Medicine

## 2021-02-10 ENCOUNTER — Encounter: Payer: Self-pay | Admitting: Internal Medicine

## 2021-02-10 ENCOUNTER — Other Ambulatory Visit: Payer: Self-pay

## 2021-02-10 ENCOUNTER — Ambulatory Visit: Payer: Self-pay | Attending: Internal Medicine | Admitting: Internal Medicine

## 2021-02-10 VITALS — BP 120/79 | HR 69 | Resp 16 | Wt 177.0 lb

## 2021-02-10 DIAGNOSIS — Z1159 Encounter for screening for other viral diseases: Secondary | ICD-10-CM

## 2021-02-10 DIAGNOSIS — R300 Dysuria: Secondary | ICD-10-CM

## 2021-02-10 DIAGNOSIS — Z23 Encounter for immunization: Secondary | ICD-10-CM

## 2021-02-10 DIAGNOSIS — E663 Overweight: Secondary | ICD-10-CM

## 2021-02-10 DIAGNOSIS — L608 Other nail disorders: Secondary | ICD-10-CM

## 2021-02-10 LAB — POCT URINALYSIS DIP (CLINITEK)
Bilirubin, UA: NEGATIVE
Blood, UA: NEGATIVE
Glucose, UA: NEGATIVE mg/dL
Ketones, POC UA: NEGATIVE mg/dL
Leukocytes, UA: NEGATIVE
Nitrite, UA: NEGATIVE
POC PROTEIN,UA: NEGATIVE
Spec Grav, UA: 1.015 (ref 1.010–1.025)
Urobilinogen, UA: 0.2 E.U./dL
pH, UA: 6 (ref 5.0–8.0)

## 2021-02-10 NOTE — Progress Notes (Signed)
Patient ID: Tracy Blevins, female    DOB: 1976/02/19  MRN: [id number]  CC: Urinary Tract Infection and Nail Problem   Subjective: Tracy Blevins is a 45 y.o. female who presents for UC visit Her concerns today include:  Pt with hx of Meniere's  Pt thinks she has a UTI She reports bloating in the stomach and urinary urgency and urine was very hot x 9 days.  Urine has a strong smell.  No dysuria Purchased Azo OTC and took for 5 day.  This helped.  Urinary frequency resolved.  She noted when she drinks dark things like Coke or coffee, symptoms want to come back.  4 mths ago, she noticed the nails on both big toes had a red color then turned brown.  They have remained this color. No pain.  Patient Active Problem List   Diagnosis Date Noted   Influenza vaccine needed 07/18/2020   Need for tetanus, diphtheria, and acellular pertussis (Tdap) vaccine 07/18/2020   Screening breast examination 02/14/2019   Breast pain, right 02/14/2019     Current Outpatient Medications on File Prior to Visit  Medication Sig Dispense Refill   meclizine (ANTIVERT) 12.5 MG tablet Take 1 tablet (12.5 mg total) by mouth 2 (two) times daily as needed for dizziness. (Patient not taking: Reported on 02/10/2021) 30 tablet 0   meclizine (ANTIVERT) 12.5 MG tablet TAKE 1 TABLET (12.5 MG TOTAL) BY MOUTH 2 (TWO) TIMES DAILY AS NEEDED FOR DIZZINESS. (Patient not taking: Reported on 02/10/2021) 30 tablet 0   sulfamethoxazole-trimethoprim (BACTRIM) 400-80 MG tablet Take 1 tablet by mouth 2 (two) times daily. (Patient not taking: No sig reported) 6 tablet 0   vitamin E 100 UNIT capsule Take by mouth daily. (Patient not taking: No sig reported)     Current Facility-Administered Medications on File Prior to Visit  Medication Dose Route Frequency Provider Last Rate Last Admin   0.9 %  sodium chloride infusion  500 mL Intravenous Once Meryl Dare, MD        Allergies  Allergen Reactions   Hydrocodone  Nausea And Vomiting and Other (See Comments)    Dizziness     Social History   Socioeconomic History   Marital status: Married    Spouse name: Not on file   Number of children: 4   Years of education: Not on file   Highest education level: Not on file  Occupational History   Not on file  Tobacco Use   Smoking status: Never   Smokeless tobacco: Never  Vaping Use   Vaping Use: Never used  Substance and Sexual Activity   Alcohol use: No   Drug use: No   Sexual activity: Yes    Birth control/protection: Surgical  Other Topics Concern   Not on file  Social History Narrative   Not on file   Social Determinants of Health   Financial Resource Strain: Not on file  Food Insecurity: Not on file  Transportation Needs: Not on file  Physical Activity: Not on file  Stress: Not on file  Social Connections: Not on file  Intimate Partner Violence: Not on file    Family History  Problem Relation Age of Onset   Hypertension Mother    Diabetes Mother    Hypertension Father    Uterine cancer Maternal Grandmother     Past Surgical History:  Procedure Laterality Date   APPENDECTOMY  2007   CESAREAN SECTION  2000   CESAREAN SECTION  2008  TUBAL LIGATION  2008    ROS: Review of Systems Negative except as stated above  PHYSICAL EXAM: BP 120/79   Pulse 69   Resp 16   Wt 177 lb (80.3 kg)   SpO2 99%   BMI 28.57 kg/m   Wt Readings from Last 3 Encounters:  02/10/21 177 lb (80.3 kg)  07/18/20 178 lb 12.8 oz (81.1 kg)  02/14/19 179 lb (81.2 kg)    Physical Exam  General appearance - alert, well appearing, and in no distress Mental status - normal mood, behavior, speech, dress, motor activity, and thought processes Nails: Toenails repair 3 except for irregular small area of brown discoloration to both big toenails.  Results for orders placed or performed in visit on 02/10/21  POCT URINALYSIS DIP (CLINITEK)  Result Value Ref Range   Color, UA yellow yellow   Clarity,  UA clear clear   Glucose, UA negative negative mg/dL   Bilirubin, UA negative negative   Ketones, POC UA negative negative mg/dL   Spec Grav, UA 0.272 5.366 - 1.025   Blood, UA negative negative   pH, UA 6.0 5.0 - 8.0   POC PROTEIN,UA negative negative, trace   Urobilinogen, UA 0.2 0.2 or 1.0 E.U./dL   Nitrite, UA Negative Negative   Leukocytes, UA Negative Negative     CMP Latest Ref Rng & Units 07/22/2017 06/18/2016 01/23/2011  Glucose 65 - 99 mg/dL 79 92 440(H)  BUN 6 - 24 mg/dL 14 14 13   Creatinine 0.57 - 1.00 mg/dL 4.74 2.59  Sodium 134 - 144 mmol/L 142 138 135  Potassium 3.5 - 5.2 mmol/L 3.9 3.6 3.2(L)  Chloride 96 - 106 mmol/L 104 103 101  CO2 20 - 29 mmol/L 23 23 24   Calcium 8.7 - 10.2 mg/dL 8.7 9.5 9.0  Total Protein 6.0 - 8.5 g/dL 6.6 - 7.6  Total Bilirubin 0.0 - 1.2 mg/dL 0.3 - 0.7  Alkaline Phos 39 - 117 IU/L 48 - 59  AST 0 - 40 IU/L 14 - 30  ALT 0 - 32 IU/L 14 - 40(H)   Lipid Panel  No results found for: CHOL, TRIG, HDL, CHOLHDL, VLDL, LDLCALC, LDLDIRECT  CBC    Component Value Date/Time   WBC 6.9 07/22/2017 1523   WBC 7.6 06/18/2016 2204   RBC 3.85 07/22/2017 1523   RBC 4.05 06/18/2016 2204   HGB 10.7 (L) 07/22/2017 1523   HCT 34.0 07/22/2017 1523   PLT 343 07/22/2017 1523   MCV 88 07/22/2017 1523   MCH 27.8 07/22/2017 1523   MCH 27.7 06/18/2016 2204   MCHC 31.5 07/22/2017 1523   MCHC 32.9 06/18/2016 2204   RDW 14.4 07/22/2017 1523   LYMPHSABS 2.2 07/22/2017 1523   MONOABS 1.2 (H) 01/23/2011 1904   EOSABS 0.2 07/22/2017 1523   BASOSABS 0.0 07/22/2017 1523    ASSESSMENT AND PLAN: 1. Dysuria UA negative for UTI.  No glucose in urine. Drink adequate water during the day - POCT URINALYSIS DIP (CLINITEK)  2. Discolored nails Does not appear to be fungus.  Observe for now.  Good feet hygiene discussed  3. Overweight (BMI 25.0-29.9) Discussed healthy eating habits.  Printed information given. Advise to get in some form of moderate intensity  exercise 4-5x/wk for 30 mins  4. Need for immunization against influenza - Flu Vaccine QUAD 18mo+IM (Fluarix, Fluzone & Alfiuria Quad PF)  5. Need for hepatitis C screening test - HCV Ab w Reflex to Quant PCR    Patient was given  the opportunity to ask questions.  Patient verbalized understanding of the plan and was able to repeat key elements of the plan.  AMN Language interpreter used during this encounter. #[id number], [place]  Orders Placed This Encounter  Procedures   Flu Vaccine QUAD 67mo+IM (Fluarix, Fluzone & Alfiuria Quad PF)   POCT URINALYSIS DIP (CLINITEK)     Requested Prescriptions    No prescriptions requested or ordered in this encounter    No follow-ups on file.  Jonah Blue, MD, FACP

## 2021-02-10 NOTE — Patient Instructions (Signed)
Alimentacin saludable Healthy Eating Seguir una modalidad de alimentacin saludable puede ayudarlo a alcanzar y mantener un peso saludable, reducir el riesgo de tener enfermedades crnicas y vivir una vida larga y productiva. Es importante que siga una modalidad de alimentacin saludable con un nivel adecuado de caloras para su cuerpo. Debe cubrir sus necesidades nutricionales principalmente a travs de los alimentos, escogiendo una variedad de alimentos ricos en nutrientes. Cules son algunos consejos para seguir este plan? Lea las etiquetas de los alimentos Lea las etiquetas y elija las que digan lo siguiente: Reducido en sodio o con bajo contenido de sodio. Jugos con 100 % jugo de fruta. Alimentos con bajo contenido de grasas saturadas y alto contenido de grasas poliinsaturadas y monoinsaturadas. Alimentos con cereales integrales, como trigo integral, trigo partido, arroz integral y arroz salvaje. Cereales integrales fortificados con cido flico. Se recomienda a las mujeres embarazadas o que desean quedar embarazadas. Lea las etiquetas y evite: Los alimentos con una gran cantidad de azcares agregados. Estos incluyen los alimentos que contienen azcar moreno, endulzante a base de maz, jarabe de maz, dextrosa, fructosa, glucosa, jarabe de maz de alta fructosa, miel, azcar invertido, lactosa, jarabe de malta, maltosa, melaza, azcar sin refinar, sacarosa, trehalosa y azcar turbinado. No consuma ms que las siguientes cantidades de azcar agregada por da: 6 cucharaditas (25 g) las mujeres. 9 cucharaditas (38 g) los hombres. Los alimentos que contienen almidones y cereales refinados o procesados. Los productos de cereales refinados, como harina blanca, harina de maz desgerminada, pan blanco y arroz blanco. Al ir de compras Elija refrigerios ricos en nutrientes, como verduras, frutas enteras y frutos secos. Evite los refrigerios con alto contenido de caloras y azcar, como las papas  fritas, los refrigerios frutales y los caramelos. Use alios y productos para untar a base de aceite con los alimentos en lugar de grasas slidas como la mantequilla, la margarina en barra o el queso crema. Limite las salsas, las mezclas y los productos "instantneos" preelaborados como el arroz saborizado, los fideos instantneos y las pastas listas para comer. Pruebe ms fuentes de protena vegetal, como tofu, tempeh, frijoles negros, edamame, lentejas, frutos secos y semillas. Explore planes de alimentacin como la dieta mediterrnea o la dieta vegetariana. Al cocinar Use aceite para saltear los alimentos en lugar de grasas slidas como mantequilla, margarina en barra o manteca de cerdo. En lugar de frer, trate de cocinar en el horno, en la plancha o en la parrilla, o hervir los alimentos. Retire la parte grasa de las carnes antes de cocinarlas. Cocine las verduras al vapor en agua o caldo. Planificacin de las comidas  En las comidas, imagine dividir su plato en cuartos: La mitad del plato tiene frutas y verduras. Un cuarto del plato tiene cereales integrales. Un cuarto del plato tiene protena, especialmente carnes magras, aves, huevos, tofu, frijoles o frutos secos. Incluya lcteos descremados en su dieta diaria. Estilo de vida Elija opciones saludables en todos los mbitos, como en el hogar, el trabajo, la escuela, los restaurantes y las tiendas. Prepare los alimentos de un modo seguro: Lvese las manos despus de manipular carnes crudas. Mantenga las superficies de preparacin de los alimentos limpias lavndolas regularmente con agua caliente y jabn. Mantenga las carnes crudas separadas de los alimentos que estn listos para comer como las frutas y las verduras. Cocine los frutos de mar, carnes, aves y huevos hasta alcanzar la temperatura interna recomendada. Almacene los alimentos a temperaturas seguras. En general: Mantenga los alimentos fros a una temperatura de 40   F (4,4 C)  o inferior. Mantenga los alimentos calientes a una temperatura de 140 F (60 C) o superior. Mantenga el congelador a una temperatura de 0 F (-17,8 C) o inferior. Los alimentos dejan de ser seguros para su consumo cuando han estado a una temperatura de entre 40 y 140 F (4,4 y 60 C) por ms de 2 horas. Qu alimentos debo consumir? Frutas Propngase comer el equivalente a 2 tazas de frutas frescas, enlatadas (en su jugo natural) o congeladas cada da. Algunos ejemplos de equivalentes a 1 taza de frutas son 1 manzana pequea, 8 fresas grandes, 1 taza de fruta enlatada,  taza de fruta desecada o 1 taza de jugo 100 %. Verduras Propngase comer el equivalente a 2 o 3 tazas de verduras frescas y congeladas cada da, incluyendo diferentes variedades y colores. Algunos ejemplos de equivalentes a 1 taza de verduras son 2 zanahorias medianas, 2 tazas de verduras de hoja verde crudas, 1 taza de verduras cortadas (crudas o cocidas) o 1 papa mediana al horno. Granos Propngase comer el equivalente a 6 onzas de cereales integrales por da. Algunos ejemplos de equivalentes a 1 onza de cereales son 1 rebanada de pan, 1 taza de cereal listo para comer, 3 tazas de palomitas de maz o  taza de arroz, pasta o cereales cocidos. Carnes y otras protenas Propngase comer el equivalente a 5 o 6 onzas de protena por da. Algunos ejemplos de equivalentes a 1 onza de protena son 1 huevo,  taza de frutos secos o semillas o 1 cucharada (16 g) de mantequilla de man. Un corte de carne o pescado del tamao de un mazo de cartas equivale aproximadamente a 3 a 4 onzas. De las protenas que consume cada semana, intente que al menos 8 onzas provengan de frutos de mar. Esto incluye el salmn, la trucha, el arenque y las anchoas. Lcteos Propngase comer el equivalente a 3 tazas de lcteos descremados o con bajo contenido de grasa cada da. Algunos ejemplos de equivalentes a 1 taza de lcteos son 1 taza (240 ml) de leche, 8  onzas (250 g) de yogur, 1 onzas (44 g) de queso natural o 1 taza (240 ml) de leche de soja fortificada. Grasas y aceites Propngase consumir alrededor de 5 cucharaditas (21 g) por da. Elija grasas monoinsaturadas, como el aceite de canola y de oliva, aguacate, mantequilla de man y la mayora de los frutos secos, o bien grasas poliinsaturadas, como el aceite de girasol, maz y soja, nueces, piones, semillas de ssamo, semillas de girasol y semillas de lino. Bebidas Propngase beber seis vasos de 8 onzas de agua por da. Limite el caf a entre tres y cinco tazas de 8 onzas por da. Limite el consumo de bebidas con cafena que tengan caloras agregadas, como los refrescos y las bebidas energizantes. Limite el consumo de alcohol a no ms de 1 medida por da si es mujer y no est embarazada, y a 2 medidas por da si es hombre. Una medida equivale a 12 onzas de cerveza (355 ml), 5 onzas de vino (148 ml) o 1 onzas de bebidas alcohlicas de alta graduacin (44 ml). Condimentos y otros alimentos Evite agregar cantidades excesivas de sal a los alimentos. Pruebe darles sabor con hierbas y especias en lugar de sal. Evite agregar azcar a los alimentos. Pruebe usar alios, salsas y productos untables a base de aceite en lugar de grasas slidas. Esta informacin se basa en las pautas generales de nutricin de los [place]. Para obtener   ms informacin, visite [email or web address]. Las cantidades exactas pueden variar en funcin de sus necesidades nutricionales. Resumen Un plan de alimentacin saludable puede ayudarlo a mantener un peso saludable, reducir el riesgo de tener enfermedades crnicas y [place] activo durante toda su vida. Planifique sus comidas. Asegrese de consumir las porciones correctas de una variedad de alimentos ricos en nutrientes. En lugar de frer, trate de cocinar en el horno, en la plancha o en la parrilla, o hervir los alimentos. Elija opciones saludables en todos los mbitos, como en el  hogar, el trabajo, la [place], los restaurantes y [place]. Esta informacin no tiene Theme park manager el consejo del mdico. Asegrese de hacerle al mdico cualquier pregunta que tenga. Document Revised: 08/30/2017 Document Reviewed: 08/30/2017 Elsevier Patient Education  2022 [employer].

## 2021-02-11 LAB — HCV AB W REFLEX TO QUANT PCR: HCV Ab: <0.1 s/co ratio (ref 0.0–0.9)

## 2021-02-11 LAB — HCV INTERPRETATION

## 2021-03-07 ENCOUNTER — Ambulatory Visit: Payer: Self-pay | Admitting: *Deleted

## 2021-03-07 NOTE — Telephone Encounter (Signed)
Reason for Disposition  Abdominal pains regularly occur about 1 hour after meals  Answer Assessment - Initial Assessment Questions 1. LOCATION: "Where does it hurt?"      Upper abdominal pain- abdominal distension 2. RADIATION: "Does the pain shoot anywhere else?" (e.g., chest, back)     No radiation 3. ONSET: "When did the pain begin?" (e.g., minutes, hours or days ago)      2 weeks ago 4. SUDDEN: "Gradual or sudden onset?"     Gradual- depends on how distended the stomach into 5. PATTERN "Does the pain come and go, or is it constant?"    - If constant: "Is it getting better, staying the same, or worsening?"      (Note: Constant means the pain never goes away completely; most serious pain is constant and it progresses)     - If intermittent: "How long does it last?" "Do you have pain now?"     (Note: Intermittent means the pain goes away completely between bouts)     Comes and goes- feels burp or has pressure-like gas- feels better after 6. SEVERITY: "How bad is the pain?"  (e.g., Scale 1-10; mild, moderate, or severe)   - MILD (1-3): doesn't interfere with normal activities, abdomen soft and not tender to touch    - MODERATE (4-7): interferes with normal activities or awakens from sleep, abdomen tender to touch    - SEVERE (8-10): excruciating pain, doubled over, unable to do any normal activities      No pain now- 7-8 7. RECURRENT SYMPTOM: "Have you ever had this type of stomach pain before?" If Yes, ask: "When was the last time?" and "What happened that time?"      no 8. CAUSE: "What do you think is causing the stomach pain?"     Gas bloating, inflammation  9. RELIEVING/AGGRAVATING FACTORS: "What makes it better or worse?" (e.g., movement, antacids, bowel movement)     No  10. OTHER SYMPTOMS: "Do you have any other symptoms?" (e.g., back pain, diarrhea, fever, urination pain, vomiting)       constipation 11. PREGNANCY: "Is there any chance you are pregnant?" "When was your last  menstrual period?"       No -LMP-now  Protocols used: Abdominal Pain - Female-A-AH, Abdominal Pain - Upper-A-AH

## 2021-03-20 ENCOUNTER — Ambulatory Visit: Payer: No Typology Code available for payment source | Admitting: Physician Assistant

## 2021-04-09 ENCOUNTER — Ambulatory Visit: Payer: No Typology Code available for payment source | Admitting: Physician Assistant

## 2021-12-01 ENCOUNTER — Ambulatory Visit: Payer: Self-pay

## 2021-12-01 NOTE — Telephone Encounter (Signed)
  Chief Complaint: abdominal pain Symptoms: LLQ pain 5-6, intermittent, difficulty having BM Frequency: 3 months  Pertinent Negatives: NA Disposition: [] ED /[] Urgent Care (no appt availability in office) / [x] Appointment(In office/virtual)/ []  [facility]/ [] Home Care/ [] Refused Recommended Disposition /[] [facility]/ []  Follow-up with PCP Additional Notes: pt son translating call for her. Scheduled pt appt for 12/03/21 at 1030. Son also reported cat scratch a few days ago to face. Pt has been applying TAO. Advised pt can address this during appt as well.   Reason for Disposition  Abdominal pain is a chronic symptom (recurrent or ongoing AND present > 4 weeks)  Answer Assessment - Initial Assessment Questions 1. LOCATION: "Where does it hurt?"      Stomach and below L sided  3. ONSET: "When did the pain begin?" (e.g., minutes, hours or days ago)      3 months 5. PATTERN "Does the pain come and go, or is it constant?"    - If it comes and goes: "How long does it last?" "Do you have pain now?"     (Note: Comes and goes means the pain is intermittent. It goes away completely between bouts.)    - If constant: "Is it getting better, staying the same, or getting worse?"      (Note: Constant means the pain never goes away completely; most serious pain is constant and gets worse.)      Comes and goes  6. SEVERITY: "How bad is the pain?"  (e.g., Scale 1-10; mild, moderate, or severe)    - MILD (1-3): Doesn't interfere with normal activities, abdomen soft and not tender to touch.     - MODERATE (4-7): Interferes with normal activities or awakens from sleep, abdomen tender to touch.     - SEVERE (8-10): Excruciating pain, doubled over, unable to do any normal activities.       5-6 10. OTHER SYMPTOMS: "Do you have any other symptoms?" (e.g., back pain, diarrhea, fever, urination pain, vomiting)       Difficulty having BM  Protocols used: Abdominal Pain - Female-A-AH

## 2021-12-03 ENCOUNTER — Ambulatory Visit: Payer: Self-pay | Attending: Physician Assistant | Admitting: Physician Assistant

## 2021-12-03 ENCOUNTER — Other Ambulatory Visit: Payer: Self-pay

## 2021-12-03 ENCOUNTER — Encounter: Payer: Self-pay | Admitting: Physician Assistant

## 2021-12-03 VITALS — BP 119/80 | HR 69 | Temp 98.6°F | Resp 17 | Wt 176.0 lb

## 2021-12-03 DIAGNOSIS — Z789 Other specified health status: Secondary | ICD-10-CM

## 2021-12-03 DIAGNOSIS — W5503XA Scratched by cat, initial encounter: Secondary | ICD-10-CM

## 2021-12-03 DIAGNOSIS — S0081XA Abrasion of other part of head, initial encounter: Secondary | ICD-10-CM

## 2021-12-03 DIAGNOSIS — M542 Cervicalgia: Secondary | ICD-10-CM

## 2021-12-03 DIAGNOSIS — R109 Unspecified abdominal pain: Secondary | ICD-10-CM

## 2021-12-03 DIAGNOSIS — K5900 Constipation, unspecified: Secondary | ICD-10-CM

## 2021-12-03 LAB — POCT URINALYSIS DIP (CLINITEK)
Bilirubin, UA: NEGATIVE
Blood, UA: NEGATIVE
Glucose, UA: NEGATIVE mg/dL
Ketones, POC UA: NEGATIVE mg/dL
Leukocytes, UA: NEGATIVE
Nitrite, UA: NEGATIVE
POC PROTEIN,UA: NEGATIVE
Spec Grav, UA: 1.025 (ref 1.010–1.025)
Urobilinogen, UA: 0.2 E.U./dL
pH, UA: 6 (ref 5.0–8.0)

## 2021-12-03 MED ORDER — FLUCONAZOLE 150 MG PO TABS
150.0000 mg | ORAL_TABLET | Freq: Once | ORAL | 0 refills | Status: AC
  Filled 2021-12-03: qty 1, 1d supply, fill #0

## 2021-12-03 MED ORDER — DOXYCYCLINE HYCLATE 100 MG PO TABS
100.0000 mg | ORAL_TABLET | Freq: Two times a day (BID) | ORAL | 0 refills | Status: DC
  Filled 2021-12-03: qty 20, 10d supply, fill #0

## 2021-12-03 MED ORDER — CYCLOBENZAPRINE HCL 10 MG PO TABS
10.0000 mg | ORAL_TABLET | Freq: Every day | ORAL | 0 refills | Status: DC
  Filled 2021-12-03: qty 30, 30d supply, fill #0

## 2021-12-03 MED ORDER — MUPIROCIN 2 % EX OINT
1.0000 | TOPICAL_OINTMENT | Freq: Two times a day (BID) | CUTANEOUS | 0 refills | Status: DC
  Filled 2021-12-03: qty 22, 11d supply, fill #0

## 2021-12-03 MED ORDER — POLYETHYLENE GLYCOL 3350 17 GM/SCOOP PO POWD
17.0000 g | Freq: Two times a day (BID) | ORAL | 1 refills | Status: DC | PRN
  Filled 2021-12-03: qty 510, 15d supply, fill #0

## 2021-12-03 NOTE — Progress Notes (Signed)
Patient ID: [name], female   DOB: April 01, 1976, 46 y.o.   MRN: [id number]   Tracy Blevins, is a 46 y.o. female  [id number]  [id number]  DOB - Apr 26, 1975  Chief Complaint  Patient presents with   Flank Pain    Pain in L hip Radiate to leg, Inhibits sleeps,  Always present, worse at night. Pain is 4 on 0-10 at night at 8 or 9  Scratched by cat on 8/10 w/ palpable lumps. Pain in jaw, neck, shoulder,           Subjective:   Tracy Blevins is a 46 y.o. female here today for Pain in neck resulting form 7 days ago was bitten by her cat.  Indoor cat.  UTD vaccines.  No fever.  Her last tetanus was 2019  Also c/o constipation that is intermittent.  Some vaginal odor.  Constipation has been ongoing for a while.  Abdomen is not painful but uncomfortable when constipated.  No N/V/D.  No pain with urination  No problems updated.  ALLERGIES: Allergies  Allergen Reactions   Hydrocodone Nausea And Vomiting and Other (See Comments)    Dizziness     PAST MEDICAL HISTORY: Past Medical History:  Diagnosis Date   Headache    Hypothyroidism    S/P appy    S/P tubal ligation     MEDICATIONS AT HOME: Prior to Admission medications   Medication Sig Start Date End Date Taking? Authorizing Provider  cyclobenzaprine (FLEXERIL) 10 MG tablet Take 1 tablet (10 mg total) by mouth at bedtime. Prn neck pain 12/03/21  Yes Anders Simmonds, PA-C  doxycycline (VIBRA-TABS) 100 MG tablet Take 1 tablet (100 mg total) by mouth 2 (two) times daily. 12/03/21  Yes Georgian Co M, PA-C  fluconazole (DIFLUCAN) 150 MG tablet Take 1 tablet (150 mg total) by mouth once for 1 dose. 12/03/21 12/04/21 Yes Dotty Gonzalo, Marzella Schlein, PA-C  mupirocin ointment (BACTROBAN) 2 % Apply 1 Application topically 2 (two) times daily. X 7days 12/03/21  Yes Fama Muenchow M, PA-C  polyethylene glycol powder (GLYCOLAX/MIRALAX) 17 GM/SCOOP powder Take 17 g by mouth 2 (two) times daily as needed. 12/03/21  Yes  Adrick Kestler M, PA-C  meclizine (ANTIVERT) 12.5 MG tablet Take 1 tablet (12.5 mg total) by mouth 2 (two) times daily as needed for dizziness. Patient not taking: Reported on 02/10/2021 07/18/20   Marcine Matar, MD  vitamin E 100 UNIT capsule Take by mouth daily. Patient not taking: No sig reported    [provider]    ROS: Neg HEENT Neg resp Neg cardiac Neg MS Neg psych Neg neuro  Objective:   Vitals:   12/03/21 1044  BP: 119/80  Pulse: 69  Resp: 17  Temp: 98.6 F (37 C)  SpO2: 99%  Weight: 176 lb (79.8 kg)   Exam General appearance : Awake, alert, not in any distress. Speech Clear. Not toxic looking HEENT: Atraumatic and Normocephalic-there are 2 scratch marks on her R cheek that are scabbed over, no surrounding erythema or edema/induration but they are TTP.  Small scratch in L superior auricle.  No streaking Neck: Supple, no JVD. No cervical lymphadenopathy. R sternocleidomastoid with mild tenderness Chest: Good air entry bilaterally, CTAB.  No rales/rhonchi/wheezing CVS: S1 S2 regular, no murmurs.  Abdomen: Bowel sounds present, Non tender and not distended with no gaurding, rigidity or rebound. Extremities: B/L Lower Ext shows no edema, both legs are warm to touch Neurology: Awake alert, and oriented X  3, CN II-XII intact, Non focal Skin: No Rash  Data Review No results found for: "HGBA1C"  Assessment & Plan   1. Abdominal pain, unspecified abdominal location - polyethylene glycol powder (GLYCOLAX/MIRALAX) 17 GM/SCOOP powder; Take 17 g by mouth 2 (two) times daily as needed.  Dispense: 510 g; Refill: 1 UA dip wnl.  Cervicoancillary sent  2. Constipation, unspecified constipation type Increase water intake, patient information provided - polyethylene glycol powder (GLYCOLAX/MIRALAX) 17 GM/SCOOP powder; Take 17 g by mouth 2 (two) times daily as needed.  Dispense: 510 g; Refill: 1  3. Cat scratch of cheek, initial encounter Will cover for  infection - doxycycline (VIBRA-TABS) 100 MG tablet; Take 1 tablet (100 mg total) by mouth 2 (two) times daily.  Dispense: 20 tablet; Refill: 0 - fluconazole (DIFLUCAN) 150 MG tablet; Take 1 tablet (150 mg total) by mouth once for 1 dose.  Dispense: 1 tablet; Refill: 0 - mupirocin ointment (BACTROBAN) 2 %; Apply 1 Application topically 2 (two) times daily. X 7days  Dispense: 22 g; Refill: 0  4. Neck pain Due to muscle spasm - cyclobenzaprine (FLEXERIL) 10 MG tablet; Take 1 tablet (10 mg total) by mouth at bedtime. Prn neck pain  Dispense: 30 tablet; Refill: 0  5. Language barrier AMN interpreters(Janeth) used and additional time performing visit was required.     Return if symptoms worsen or fail to improve.  The patient was given clear instructions to go to ER or return to medical center if symptoms don't improve, worsen or new problems develop. The patient verbalized understanding. The patient was told to call to get lab results if they haven't heard anything in the next week.      Georgian Co, PA-C [facility] [place], [place] [phone]   12/03/2021, 11:09 AM

## 2021-12-03 NOTE — Patient Instructions (Signed)
Enfermedad por araazo de gato en adultos Cat-Scratch Disease, Adult La enfermedad por araazo de gato es una infeccin bacteriana que se propaga a los seres humanos a travs de la mordedura o el araazo de un gato infectado. La infeccin tambin puede propagarse al tener contacto con un gato infectado y luego tocarse los ojos o la boca. A veces se le llama fiebre por araazo de gato. La infeccin no se transmite de [place] persona a otra (no es contagiosa). Cules son las causas? Esta enfermedad es causada por una bacteria llamada Bartonella hensalae. Estas bacterias son transportadas por las pulgas. Los gatos se infectan por picaduras de pulgas o excrementos de pulgas. Las bacterias pueden estar presentes en la boca o en las garras de gatos o gatitos, especialmente de aquellos que son menores de 1 [place]. Los gatos no se ven enfermos ni actan como si estuvieran enfermos cuando tienen esta infeccin. Qu incrementa el riesgo? Es ms probable que contraiga esta afeccin si: Si tiene un gato o interacta con uno. Tiene debilitado el sistema de defensa del organismo (sistema inmunitario). Su sistema inmunitario puede estar debilitado si: Est embarazada. Tiene ciertas afecciones como cncer, VIH o sida. Recibi un rgano donado (trasplante). Cules son los signos o sntomas? Los sntomas frecuentes de esta afeccin incluyen los siguientes: Neomia Dear mordedura o un araazo que no se curan. Una protuberancia roja cerca de la herida. La protuberancia puede ser roja y estar caliente y sensible al tacto. Otros sntomas pueden tardar unas semanas en aparecer. Estos pueden incluir los siguientes: Glndulas (ganglios linfticos) del cuello o debajo de los brazos hinchadas y sensibles. Grant Ruts. Erupcin cutnea. Dolor en las articulaciones. Dolor de [place]. Poca energa(languidez). Prdida del apetito. Cmo se diagnostica? Esta enfermedad se diagnostica en funcin de los sntomas y los antecedentes que tenga de  un araazo o mordedura de [place]. El mdico le examinar la piel y buscar ganglios linfticos inflamados. Pueden hacerle estudios, por ejemplo: Una prueba de cultivo. Esto implica analizar Lauris Poag de lquido o pus de la herida. Anlisis de [place]. Una biopsia de ganglios linfticos. Esto puede incluir extraer y Interior and spatial designer de tejido de un ganglio linftico inflamado. Cmo se trata? La enfermedad por araazo de gato generalmente desaparece sin tratamiento en 2 a 4 meses. Sin embargo, una infeccin ms grave puede requerir antibiticos si: Tiene otras enfermedades o problemas que debilitan el sistema inmunitario. Los sntomas le causan molestias. Si tiene un ganglio linftico hinchado y Armed forces training and education officer, es posible que deba drenarse con Neomia Dear aguja. Esto es poco frecuente. Siga estas indicaciones en su casa: Medicamentos Use los medicamentos de venta libre y los recetados solamente como se lo haya indicado el mdico. Si le recetaron un antibitico, tmelo como se lo haya indicado el mdico. No deje de tomar el antibitico aunque comience a sentirse mejor. Indicaciones generales Retome sus actividades normales segn lo indicado por el mdico. Pregntele al mdico qu actividades son seguras para usted. Aplique compresas tibias en la zona como se lo haya indicado el mdico. Controle las zonas de la herida o los ganglios linfticos inflamados todos los das para detectar signos de infeccin. Preste atencin a los siguientes signos: Enrojecimiento, Optician, dispensing. Lquido o sangre. Calor. Pus o mal olor. Concurra a todas las visitas de seguimiento. Esto es importante. Cmo se previene? Evite jugar bruscamente o quitarle la comida a Financial controller. Lvese bien las manos despus de tener contacto con un gato. Si es araado o mordido, lave la zona lesionada con agua tibia  y jabn tan pronto como sea posible. No permita que un gato lama su piel si tiene cortes, llagas o araazos. No recoja gatos  callejeros ni juegue con ellos. Si tiene Pharmacist, community interior: No permita que su gato salga de la casa. Tener contacto con gatos callejeros puede aumentar la probabilidad de que su gato tenga contacto con bacterias. Recorte las uas de [place]. Revise a su gato para detectar pulgas. Pregunte a su veterinario qu repelente para pulgas y garrapatas es seguro para usted y [place]. Comunquese con un mdico si: Tiene signos de infeccin en la herida o los ganglios linfticos, tales como: Enrojecimiento, hinchazn o dolor. Lquido o sangre. Calor. Pus o mal olor. Tiene fiebre. Los sntomas empeoran o no mejoran. Siente dolor [employer]. Solicite ayuda de inmediato si: Siente dolor en el abdomen. La erupcin cutnea se propaga. Se siente mareado o se desmaya. Tiene los ojos inflamados o problemas de visin. Siente rigidez en el cuello. Estos sntomas pueden representar un problema grave que constituye Radio broadcast assistant. No espere a ver si los sntomas desaparecen. Solicite atencin mdica de inmediato. Comunquese con el servicio de emergencias de su localidad (911 en los [place]). No conduzca por sus propios medios [employer]. Resumen La enfermedad por araazo de [place], a veces llamada fiebre por araazo de gato es una infeccin bacteriana causada por la mordedura o el araazo de un gato infectado. La infeccin puede causar una protuberancia roja cerca de la herida, ganglios linfticos inflamados y sntomas similares a la gripe. Las bacterias que causan esta afeccin pueden estar presentes en la boca o en las garras de gatos o gatitos, especialmente de aquellos que son menores de 1 ao. Si le recetaron un antibitico, tmelo como se lo haya indicado el mdico. No deje de tomar el antibitico aunque comience a sentirse mejor. Esta informacin no tiene Theme park manager el consejo del mdico. Asegrese de hacerle al mdico cualquier pregunta que tenga. Document Revised:  09/11/2020 Document Reviewed: 09/11/2020 Elsevier Patient Education  2023 [employer].

## 2021-12-04 LAB — CERVICOVAGINAL ANCILLARY ONLY
Bacterial Vaginitis (gardnerella): NEGATIVE
Candida Glabrata: NEGATIVE
Candida Vaginitis: NEGATIVE
Chlamydia: NEGATIVE
Comment: NEGATIVE
Comment: NEGATIVE
Comment: NEGATIVE
Comment: NEGATIVE
Comment: NEGATIVE
Comment: NORMAL
Neisseria Gonorrhea: NEGATIVE
Trichomonas: NEGATIVE

## 2022-01-12 ENCOUNTER — Ambulatory Visit: Payer: Self-pay | Admitting: *Deleted

## 2022-01-12 NOTE — Telephone Encounter (Signed)
Patient's son Misael assisting patient with translating. Not on DPR . Son giving NT information not receiving information. Chief Complaint: requesting appt headache  Symptoms: headache, nausea, dizziness, blurred vision, extremities get cold at times.  Frequency: last night  Pertinent Negatives: Patient denies N/T extremities. No weakness in extremities. No double vision. No slurred speech Disposition: [x] ED /[] Urgent Care (no appt availability in office) / [] Appointment(In office/virtual)/ []  [facility]/ [] Home Care/ [] Refused Recommended Disposition /[] [facility]/ []  Follow-up with PCP Additional Notes:   Patent requesting appt. Does not want to go to ED. Patient would like to have any available appt.  Please advise and call patient back. Unsure if patient will go to ED.    Reason for Disposition  [1] SEVERE headache (e.g., excruciating) AND [2] not improved after 2 hours of pain medicine  Answer Assessment - Initial Assessment Questions 1. LOCATION: "Where does it hurt?"      Front of head to back of neck  2. ONSET: "When did the headache start?" (Minutes, hours or days)      Last night  3. PATTERN: "Does the pain come and go, or has it been constant since it started?"     Constant  4. SEVERITY: "How bad is the pain?" and "What does it keep you from doing?"  (e.g., Scale 1-10; mild, moderate, or severe)   - MILD (1-3): doesn't interfere with normal activities    - MODERATE (4-7): interferes with normal activities or awakens from sleep    - SEVERE (8-10): excruciating pain, unable to do any normal activities        Moderate needs to "drink tylenol to sleep". When awake headache returns  5. RECURRENT SYMPTOM: "Have you ever had headaches before?" If Yes, ask: "When was the last time?" and "What happened that time?"      No  6. CAUSE: "What do you think is causing the headache?"     Not sure  7. MIGRAINE: "Have you been diagnosed with migraine headaches?" If  Yes, ask: "Is this headache similar?"      na 8. HEAD INJURY: "Has there been any recent injury to the head?"      na 9. OTHER SYMPTOMS: "Do you have any other symptoms?" (fever, stiff neck, eye pain, sore throat, cold symptoms)     Dizziness , blurred vision, headache, extremities  cool at times  10. PREGNANCY: "Is there any chance you are pregnant?" "When was your last menstrual period?"       Na  Protocols used: Headache-A-AH

## 2022-01-27 ENCOUNTER — Ambulatory Visit: Payer: Self-pay

## 2022-01-27 NOTE — Telephone Encounter (Signed)
  Chief Complaint: Hip pain Symptoms: Hip pain shoots down leg sometimes, sciatica, disk pain. Frequency: 2 years Pertinent Negatives: Patient denies Fever Disposition: [] ED /[] Urgent Care (no appt availability in office) / [] Appointment(In office/virtual)/ []  [facility]/ [] Home Care/ [] Refused Recommended Disposition /[x] [facility]/ [x]  Follow-up with PCP Additional Notes: PT has had hip pain for 2 years. Sometimes pain is 2-3/10 and other times 10/10. Pt mentioned disk issues and pain going down leg. PT has only found relief with a medication purchased over the Internet.  PT also mentions irregular periods. PT needs pap smear and mammogram. [facility] and follow up in office with December appt.

## 2022-01-27 NOTE — Telephone Encounter (Signed)
Reason for Disposition  Hip pain is a chronic symptom (recurrent or ongoing AND present > 4 weeks)  Answer Assessment - Initial Assessment Questions 1. LOCATION and RADIATION: "Where is the pain located?"      Left hip 2. QUALITY: "What does the pain feel like?"  (e.g., sharp, dull, aching, burning)     burning 3. SEVERITY: "How bad is the pain?" "What does it keep you from doing?"   (Scale 1-10; or mild, moderate, severe)   -  MILD (1-3): doesn't interfere with normal activities    -  MODERATE (4-7): interferes with normal activities (e.g., work or school) or awakens from sleep, limping    -  SEVERE (8-10): excruciating pain, unable to do any normal activities, unable to walk     Sometimes  3-5/10 sometimes a 10/10 4. ONSET: "When did the pain start?" "Does it come and go, or is it there all the time?"     2 years 5. WORK OR EXERCISE: "Has there been any recent work or exercise that involved this part of the body?"      no 6. CAUSE: "What do you think is causing the hip pain?"      Unsure - Disk pain 7. AGGRAVATING FACTORS: "What makes the hip pain worse?" (e.g., walking, climbing stairs, running)    Walking a lot  8. OTHER SYMPTOMS: "Do you have any other symptoms?" (e.g., back pain, pain shooting down leg,  fever, rash)     Goes down leg  Protocols used: Hip Pain-A-AH

## 2022-02-09 ENCOUNTER — Ambulatory Visit: Payer: Self-pay | Attending: Internal Medicine

## 2022-02-09 ENCOUNTER — Ambulatory Visit: Payer: Self-pay | Admitting: Physician Assistant

## 2022-02-09 ENCOUNTER — Encounter: Payer: Self-pay | Admitting: Physician Assistant

## 2022-02-09 ENCOUNTER — Other Ambulatory Visit: Payer: Self-pay

## 2022-02-09 VITALS — BP 129/82 | HR 88 | Wt 169.0 lb

## 2022-02-09 DIAGNOSIS — G8929 Other chronic pain: Secondary | ICD-10-CM

## 2022-02-09 DIAGNOSIS — N926 Irregular menstruation, unspecified: Secondary | ICD-10-CM

## 2022-02-09 DIAGNOSIS — M5442 Lumbago with sciatica, left side: Secondary | ICD-10-CM

## 2022-02-09 MED ORDER — NAPROXEN 500 MG PO TABS
500.0000 mg | ORAL_TABLET | Freq: Two times a day (BID) | ORAL | 1 refills | Status: DC
  Filled 2022-02-09: qty 60, 30d supply, fill #0

## 2022-02-09 NOTE — Progress Notes (Signed)
Established Patient Office Visit  Subjective   Patient ID: Tracy Blevins, female    DOB: 23-Jul-1975  Age: 46 y.o. MRN: [id number]  Chief Complaint  Patient presents with   Hip Pain    Persistent for 3 yrs, pain worsening    Has used flexeril but makes her sleepy  States that she has been experiencing low left back pain that radiates down her left leg.  States that this has been ongoing for 3 years, however on review of her medical records it does look like this has been worse in the past 3 years, however has been ongoing for over 10.  States that she was last seen by orthopedics in July 2019 for this issue.  Note from that visit  HPI: Patient presents for evaluation of low back pain.  Since I have seen her she has had MRI of her lumbar spine.  I have reviewed that with her and the interpreter today.  This does show left-sided L5-S1 likely nerve root irritation from bulging disc into the left foramen.  Symptoms have been going on for a year but getting worse over the last 4 months.  Does describe radicular symptoms on both the left-hand side and to a lesser extent the right-hand side.  She is unable to do activities of daily living without symptoms.  Assessment & Plan: Visit Diagnoses:  1. Chronic low back pain, unspecified back pain laterality, with sciatica presence unspecified    Plan: Impression is low back pain with cooperating findings on MRI scan.  Plan is to refer her to Dr. Alvester Morin for lumbar spine ESI x1 were to and then physical therapy to start a month from now.  I do not see this is a surgical problem.  No nerve root tension signs today.  Primarily activity related back pain.                                                                         States today that she has previously tried muscle relaxers without much relief and states that they make her very sleepy.  States that she has been taking glucosamine over-the-counter and states that that does offer some  relief but she was unsure if she should continue this.  States that she will also use ibuprofen at night with some relief.  Also complains today of irregular periods, states that they are lasting longer and are closer together.  States that this has been going on for the past few months.   Due to language barrier, an interpreter was present during the history-taking and subsequent discussion (and for part of the physical exam) with this patient.   Past Medical History:  Diagnosis Date   Headache    Hypothyroidism    S/P appy    S/P tubal ligation    Social History   Socioeconomic History   Marital status: Married    Spouse name: Not on file   Number of children: 4   Years of education: Not on file   Highest education level: Not on file  Occupational History   Not on file  Tobacco Use   Smoking status: Never   Smokeless tobacco: Never  Vaping Use   Vaping Use:  Never used  Substance and Sexual Activity   Alcohol use: No   Drug use: No   Sexual activity: Yes    Birth control/protection: Surgical  Other Topics Concern   Not on file  Social History Narrative   Not on file   Social Determinants of Health   Financial Resource Strain: Not on file  Food Insecurity: Not on file  Transportation Needs: No Transportation Needs (02/14/2019)   PRAPARE - Transportation    Lack of Transportation (Medical): No    Lack of Transportation (Non-Medical): No  Physical Activity: Not on file  Stress: Not on file  Social Connections: Not on file  Intimate Partner Violence: Not on file   Family History  Problem Relation Age of Onset   Hypertension Mother    Diabetes Mother    Hypertension Father    Uterine cancer Maternal Grandmother    Allergies  Allergen Reactions   Hydrocodone Nausea And Vomiting and Other (See Comments)    Dizziness     Review of Systems  Constitutional: Negative.   HENT: Negative.    Eyes: Negative.   Respiratory:  Negative for shortness of breath.    Cardiovascular:  Negative for chest pain.  Gastrointestinal: Negative.   Genitourinary: Negative.   Musculoskeletal:  Positive for back pain and myalgias.  Skin: Negative.   Neurological: Negative.   Endo/Heme/Allergies: Negative.   Psychiatric/Behavioral: Negative.        Objective:     BP 129/82 (BP Location: Right Arm)   Pulse 88   Wt 169 lb (76.7 kg)   BMI 27.28 kg/m  BP Readings from Last 3 Encounters:  02/09/22 129/82  12/03/21 119/80  02/10/21 120/79   Wt Readings from Last 3 Encounters:  02/09/22 169 lb (76.7 kg)  12/03/21 176 lb (79.8 kg)  02/10/21 177 lb (80.3 kg)      Physical Exam Vitals and nursing note reviewed.  Constitutional:      Appearance: Normal appearance.  HENT:     Head: Normocephalic and atraumatic.     Right Ear: External ear normal.     Left Ear: External ear normal.     Nose: Nose normal.     Mouth/Throat:     Mouth: Mucous membranes are moist.     Pharynx: Oropharynx is clear.  Eyes:     Extraocular Movements: Extraocular movements intact.     Conjunctiva/sclera: Conjunctivae normal.     Pupils: Pupils are equal, round, and reactive to light.  Cardiovascular:     Rate and Rhythm: Normal rate and regular rhythm.     Pulses: Normal pulses.     Heart sounds: Normal heart sounds.  Pulmonary:     Effort: Pulmonary effort is normal.     Breath sounds: Normal breath sounds.  Musculoskeletal:     Cervical back: Normal range of motion and neck supple.     Comments: Unable to perform orthopedic exam due to limited space in screening van.   Skin:    General: Skin is warm and dry.  Neurological:     General: No focal deficit present.     Mental Status: She is alert and oriented to person, place, and time.  Psychiatric:        Mood and Affect: Mood normal.        Behavior: Behavior normal.        Thought Content: Thought content normal.        Judgment: Judgment normal.      Assessment & Plan:  Problem List Items Addressed  This Visit       Nervous and Auditory   Chronic left-sided low back pain with left-sided sciatica - Primary   Relevant Medications   naproxen (NAPROSYN) 500 MG tablet   Other Relevant Orders   Ambulatory referral to Orthopedic Surgery     Other   Irregular periods   Relevant Orders   Basic Metabolic Panel (Completed)   TSH (Completed)   CBC with Differential/Platelet (Completed)  1. Chronic left-sided low back pain with left-sided sciatica Trial of naproxen.  Patient encouraged to continue glucosamine over-the-counter supplement.  Patient encouraged to do trial of vitamin D supplement over-the-counter.  Patient education given on supportive care.  Red flags given for prompt reevaluation. - Ambulatory referral to Orthopedic Surgery - naproxen (NAPROSYN) 500 MG tablet; Take 1 tablet (500 mg total) by mouth 2 (two) times daily with a meal.  Dispense: 60 tablet; Refill: 1  2. Irregular periods Patient has upcoming appointment with primary care provider for annual Pap.  Patient education given on perimenopause supportive care. - Basic Metabolic Panel; Future - TSH; Future - CBC with Differential/Platelet; Future   No AVS created, patient declines MyChart.  No printer available in screening Zenaida Niece.  patient education provided with teach back method.   I have reviewed the patient's medical history (PMH, PSH, Social History, Family History, Medications, and allergies) , and have been updated if relevant. I spent 40 minutes reviewing chart and  face to face time with patient.    Return if symptoms worsen or fail to improve.    Kasandra Knudsen Mayers, PA-C

## 2022-02-10 ENCOUNTER — Ambulatory Visit: Payer: No Typology Code available for payment source | Admitting: Physician Assistant

## 2022-02-10 ENCOUNTER — Ambulatory Visit: Payer: Self-pay | Admitting: Physician Assistant

## 2022-02-10 DIAGNOSIS — N926 Irregular menstruation, unspecified: Secondary | ICD-10-CM | POA: Insufficient documentation

## 2022-02-10 DIAGNOSIS — G8929 Other chronic pain: Secondary | ICD-10-CM | POA: Insufficient documentation

## 2022-02-10 DIAGNOSIS — R739 Hyperglycemia, unspecified: Secondary | ICD-10-CM

## 2022-02-10 DIAGNOSIS — R7303 Prediabetes: Secondary | ICD-10-CM

## 2022-02-10 LAB — CBC WITH DIFFERENTIAL/PLATELET
Basophils Absolute: 0 10*3/uL (ref 0.0–0.2)
Basos: 1 %
EOS (ABSOLUTE): 0.1 10*3/uL (ref 0.0–0.4)
Eos: 1 %
Hematocrit: 35.8 % (ref 34.0–46.6)
Hemoglobin: 11.5 g/dL (ref 11.1–15.9)
Immature Grans (Abs): 0 10*3/uL (ref 0.0–0.1)
Immature Granulocytes: 0 %
Lymphocytes Absolute: 1.9 10*3/uL (ref 0.7–3.1)
Lymphs: 28 %
MCH: 27.4 pg (ref 26.6–33.0)
MCHC: 32.1 g/dL (ref 31.5–35.7)
MCV: 85 fL (ref 79–97)
Monocytes Absolute: 0.4 10*3/uL (ref 0.1–0.9)
Monocytes: 6 %
Neutrophils Absolute: 4.2 10*3/uL (ref 1.4–7.0)
Neutrophils: 64 %
Platelets: 315 10*3/uL (ref 150–450)
RBC: 4.19 x10E6/uL (ref 3.77–5.28)
RDW: 15.4 % (ref 11.7–15.4)
WBC: 6.6 10*3/uL (ref 3.4–10.8)

## 2022-02-10 LAB — POCT GLYCOSYLATED HEMOGLOBIN (HGB A1C): Hemoglobin A1C: 5.7 % — AB (ref 4.0–5.6)

## 2022-02-10 LAB — BASIC METABOLIC PANEL
BUN/Creatinine Ratio: 30 — ABNORMAL HIGH (ref 9–23)
BUN: 16 mg/dL (ref 6–24)
CO2: 25 mmol/L (ref 20–29)
Calcium: 9 mg/dL (ref 8.7–10.2)
Chloride: 102 mmol/L (ref 96–106)
Creatinine, Ser: 0.54 mg/dL — ABNORMAL LOW (ref 0.57–1.00)
Glucose: 134 mg/dL — ABNORMAL HIGH (ref 70–99)
Potassium: 4.3 mmol/L (ref 3.5–5.2)
Sodium: 140 mmol/L (ref 134–144)
eGFR: 116 mL/min/{1.73_m2} (ref >59)

## 2022-02-10 LAB — TSH: TSH: 1.59 u[IU]/mL (ref 0.450–4.500)

## 2022-02-10 NOTE — Progress Notes (Signed)
Patient given patient education on prediabetes lifestyle modifications

## 2022-02-10 NOTE — Progress Notes (Signed)
Interpreter [id number], Patient informed of recent lab results, she is scheduled for an appointment today 10.24.23 to have her A1C checked.

## 2022-03-30 ENCOUNTER — Ambulatory Visit: Payer: No Typology Code available for payment source | Admitting: Nurse Practitioner

## 2022-04-22 ENCOUNTER — Other Ambulatory Visit (HOSPITAL_COMMUNITY)
Admission: RE | Admit: 2022-04-22 | Discharge: 2022-04-22 | Disposition: A | Discharge status: Home / Self Care | Payer: Self-pay | Source: Ambulatory Visit | Attending: Nurse Practitioner | Admitting: Nurse Practitioner

## 2022-04-22 ENCOUNTER — Encounter: Payer: Self-pay | Admitting: Physician Assistant

## 2022-04-22 ENCOUNTER — Other Ambulatory Visit: Payer: Self-pay

## 2022-04-22 ENCOUNTER — Ambulatory Visit: Payer: Self-pay | Attending: Nurse Practitioner | Admitting: Physician Assistant

## 2022-04-22 VITALS — BP 144/94 | HR 68 | Wt 175.8 lb

## 2022-04-22 DIAGNOSIS — Z124 Encounter for screening for malignant neoplasm of cervix: Secondary | ICD-10-CM

## 2022-04-22 DIAGNOSIS — R739 Hyperglycemia, unspecified: Secondary | ICD-10-CM

## 2022-04-22 DIAGNOSIS — Z1239 Encounter for other screening for malignant neoplasm of breast: Secondary | ICD-10-CM

## 2022-04-22 DIAGNOSIS — G8929 Other chronic pain: Secondary | ICD-10-CM

## 2022-04-22 DIAGNOSIS — M5442 Lumbago with sciatica, left side: Secondary | ICD-10-CM

## 2022-04-22 MED ORDER — DICLOFENAC SODIUM 75 MG PO TBEC
75.0000 mg | DELAYED_RELEASE_TABLET | Freq: Two times a day (BID) | ORAL | 0 refills | Status: DC
  Filled 2022-04-22: qty 30, 15d supply, fill #0

## 2022-04-22 MED ORDER — METHOCARBAMOL 500 MG PO TABS
1000.0000 mg | ORAL_TABLET | Freq: Three times a day (TID) | ORAL | 0 refills | Status: DC | PRN
  Filled 2022-04-22: qty 90, 15d supply, fill #0

## 2022-04-22 NOTE — Patient Instructions (Signed)
Hiperglucemia Hyperglycemia La hiperglucemia se produce cuando el nivel de azcar (glucosa) en la sangre es demasiado alto. Un nivel alto de azcar en la sangre puede presentarse tanto en personas que tienen diabetes como en las que no la tienen. El nivel de azcar en la sangre puede elevarse rpidamente. Puede ser una emergencia. Cules son las causas? Si tiene diabetes, el nivel alto de azcar en la sangre puede deberse a lo siguiente: Medicamentos que aumentan el azcar en la sangre o que afectan el control de la diabetes. Disminuir la actividad fsica. Comer en exceso. Estar enfermo o lesionado, o tener una infeccin. Someterse a una ciruga. Estrs. No administrarse la insulina suficiente (si usa). El nivel alto de azcar en la sangre puede deberse a que usted tiene diabetes que an no se le ha diagnosticado. Si no tiene diabetes, el nivel alto de azcar en la sangre puede deberse a lo siguiente: Ciertos medicamentos. Estrs. Una enfermedad grave. Una infeccin. Someterse a una ciruga. Enfermedades del pncreas. Qu incrementa el riesgo? Es ms probable que una persona con factores de riesgo de diabetes tenga esta afeccin, por ejemplo: Tener un familiar con diabetes. Ciertas afecciones en las que el sistema de defensa del cuerpo (sistema inmunitario) se ataca a s mismo. Estos se conocen como trastornos autoinmunitarios. Tener sobrepeso. Ser sedentario. Tener una afeccin llamada resistencia a la insulina. Tener antecedentes de lo siguiente: Prediabetes. Diabetes durante el embarazo. Sndrome del ovario poliqustico (SOP). Cules son los signos o sntomas? Es posible que esta afeccin no cause sntomas. Si tiene sntomas, pueden incluir los siguientes: Estar ms sediento que lo habitual. Necesidad de orinar con mayor frecuencia que lo normal. Hambre. Mucho cansancio. Visin borrosa. A medida que la afeccin empeora, puede presentar otros sntomas, como los  siguientes: Sequedad en la boca. Dolor en el vientre (abdomen). Falta de hambre (inapetencia). Aliento con olor a fruta. Debilidad. Prdida de peso no planificada. Hormigueo o adormecimiento en las manos o los pies. Dolor de cabeza. Cortes o moretones que tardan en curarse. Cmo se trata? El tratamiento depende de la causa de la afeccin. El tratamiento puede incluir: Tomar medicamentos para controlar los niveles de azcar en la sangre. Cambiar los medicamentos o la dosis si recibe insulina u otros medicamentos para la diabetes. Cambios en el estilo de vida. Pueden incluir: Hacer ms ejercicio. Consumir alimentos ms saludables. Bajar de peso. Tratar una enfermedad o afeccin. Controlarse el nivel de azcar en la sangre con mayor frecuencia. Suspender o reducir los medicamentos con corticoesteroides. Si la afeccin es muy grave, deber recibir tratamiento en el hospital. Siga estas instrucciones en su casa: Instrucciones generales Use los medicamentos de venta libre y los recetados solamente como se lo haya indicado el mdico. No fume ni consuma ningn producto que contenga nicotina o tabaco. Si necesita ayuda para dejar de fumar, consulte al mdico. Si bebe alcohol: Limite la cantidad que bebe a lo siguiente: De 0 a 1 medida por da para las mujeres que no estn embarazadas. De 0 a 2 medidas por da para los hombres. Sepa cunta cantidad de alcohol hay en las bebidas. En los [place], una medida equivale a una botella de cerveza de 12 oz (355 ml), un vaso de vino de 5 oz (148 ml) o un vaso de una bebida alcohlica de alta graduacin de 1 oz (44 ml). Controle el estrs. Si necesita ayuda para lograrlo, consulte al mdico. Haga los ejercicios como se lo haya indicado el mdico. Cumpla con todas las visitas de   seguimiento. Comida y bebida  Mantenga un peso saludable. Asegrese de beber suficiente lquido cuando: Actividad fsica. Se enferma. El clima es caluroso. Beba  suficiente lquido para Contractor pis (orina) de color amarillo plido. Si usted tiene diabetes:  Conozca los sntomas de nivel alto de Dispensing optician. Siga el plan de control de la diabetes como se lo haya indicado el mdico. Asegrese de hacer lo siguiente: Aplquese la insulina y tome los medicamentos como se lo hayan indicado. Siga su plan de ejercicio. Siga su plan de comidas. Coma a horario. No omita comidas. Contrlese el nivel de azcar en la sangre con la frecuencia que le hayan indicado. Contrlese antes y despus de Engineer, site. Si hace ejercicio durante ms tiempo o de [employer], contrlese el azcar en la sangre con mayor frecuencia. Siga el plan para los das de enfermedad cuando no pueda comer ni beber normalmente. [place] este plan de antemano con el [place] su plan de control de la diabetes con sus compaeros de trabajo y de Cytogeneticist, y con las personas con las que [place]. Contrlese las cetonas en la orina cuando est enfermo y [place] se lo haya indicado el mdico. Lleve consigo una tarjeta, o use una medalla o un brazalete que indique que tiene diabetes. Dnde buscar ms informacin American Diabetes Association (Asociacin [place] de la Diabetes): www.diabetes.org Comunquese con un mdico si: El nivel de azcar en la sangre es igual o mayor que 240 mg/dl (13.3 mmol/dl) durante 2 das seguidos. Tiene problemas para Advertising account executive de azcar en la sangre dentro del intervalo deseado. Tiene presin arterial alta con frecuencia. Tiene signos de enfermedad, por ejemplo: Sensacin de que va a vomitar (nuseas). Vmitos. Cristy Hilts. Solicite ayuda de inmediato si: El monitor de azcar en la sangre indica un nivel "alto" incluso cuando se est administrando insulina. Tiene dificultad para respirar. Tiene cambios en la manera de sentirse, pensar o actuar (estado mental). Siente ganas de vomitar y la sensacin no desaparece. No puede dejar de  vomitar. Estos sntomas pueden Sales executive. Solicite atencin mdica de inmediato. Comunquese con el servicio de emergencias de su localidad (911 en los [place]). No espere a ver si los sntomas desaparecen. No conduzca por sus propios medios [employer] hospital. Resumen La hiperglucemia se produce cuando el nivel de azcar (glucosa) en la sangre es demasiado alto. Un nivel alto de azcar en la sangre puede presentarse tanto en personas que tienen diabetes como en las que no la tienen. Asegrese de beber una cantidad suficiente de lquidos y de seguir su plan de comidas. Ejerctese con la frecuencia que le haya indicado el mdico. Comunquese con el mdico si tiene problemas para Advertising account executive de azcar en la sangre dentro del intervalo deseado. Esta informacin no tiene Marine scientist el consejo del mdico. Asegrese de hacerle al mdico cualquier pregunta que tenga. Document Revised: 02/06/2020 Document Reviewed: 02/06/2020 Elsevier Patient Education  [place].

## 2022-04-22 NOTE — Progress Notes (Signed)
Patient ID: Tracy Blevins, female   DOB: [birth year], 47 y.o.   MRN: [id number]   Tracy Blevins, is a 47 y.o. female  MCN:[id number]  OQH:[id number]  DOB - 1976-03-03  Chief Complaint  Patient presents with   Gynecologic Exam       Subjective:   [name] is a 47 y.o. female here today for a pap smear and referral for a mammogram.  She is continuing to have low back pain with L leg pain.  Naproxen and flexeril did not help.  LMP 04/10/2022  No problems updated.  ALLERGIES: Allergies  Allergen Reactions   Hydrocodone Nausea And Vomiting and Other (See Comments)    Dizziness     PAST MEDICAL HISTORY: Past Medical History:  Diagnosis Date   Headache    Hypothyroidism    S/P appy    S/P tubal ligation     MEDICATIONS AT HOME: Prior to Admission medications   Medication Sig Start Date End Date Taking? Authorizing Provider  diclofenac (VOLTAREN) 75 MG EC tablet Take 1 tablet (75 mg total) by mouth 2 (two) times daily. 04/22/22  Yes Kendell Gammon, Dionne Bucy, PA-C  methocarbamol (ROBAXIN) 500 MG tablet Take 2 tablets (1,000 mg total) by mouth every 8 (eight) hours as needed for muscle spasms. 04/22/22  Yes Argentina Donovan, PA-C  doxycycline (VIBRA-TABS) 100 MG tablet Take 1 tablet (100 mg total) by mouth 2 (two) times daily. Patient not taking: Reported on 04/22/2022 12/03/21   Argentina Donovan, PA-C  meclizine (ANTIVERT) 12.5 MG tablet Take 1 tablet (12.5 mg total) by mouth 2 (two) times daily as needed for dizziness. Patient not taking: Reported on 04/22/2022 07/18/20   Ladell Pier, MD  mupirocin ointment (BACTROBAN) 2 % Apply 1 Application topically 2 (two) times daily. X 7days Patient not taking: Reported on 04/22/2022 12/03/21   Argentina Donovan, PA-C  polyethylene glycol powder (GLYCOLAX/MIRALAX) 17 GM/SCOOP powder Mix 17 g with 4-8 ounces of a beverage and take by mouth 2 (two) times daily as needed. Patient not taking: Reported on 02/09/2022 12/03/21   Argentina Donovan, PA-C  vitamin E 100 UNIT capsule Take by mouth daily. Patient not taking: Reported on 07/18/2020    [provider]    ROS: Neg HEENT Neg resp Neg cardiac Neg GI Neg GU Neg psych Neg neuro  Objective:   Vitals:   04/22/22 1006  BP: (!) 144/94  Pulse: 68  SpO2: 98%  Weight: 175 lb 12.8 oz (79.7 kg)   Exam General appearance : Awake, alert, not in any distress. Speech Clear. Not toxic looking HEENT: Atraumatic and Normocephalic GU-external genitalia WNL.  Speculum inserted.  Cervix retroverted-able to visualize superior aspect and partial os.  Pap taken.  Bimanual unremarkable Extremities: B/L Lower Ext shows no edema, both legs are warm to touch Neurology: Awake alert, and oriented X 3, CN II-XII intact, Non focal Skin: No Rash  Data Review Lab Results  Component Value Date   HGBA1C 5.7 (A) 02/10/2022    Assessment & Plan   1. Hyperglycemia I have had a lengthy discussion and provided education about insulin resistance and the intake of too much sugar/refined carbohydrates.  I have advised the patient to work at a goal of eliminating sugary drinks, candy, desserts, sweets, refined sugars, processed foods, and white carbohydrates.  The patient expresses understanding.  - Hemoglobin A1c  2. Encounter for breast cancer screening using non-mammogram modality - MM Digital Screening; Future  3.  Screening for malignant neoplasm of cervix - Cytology - PAP([facility])  4. Chronic left-sided low back pain with left-sided sciatica No new-unchanged with first line measures Change meds and will refer - diclofenac (VOLTAREN) 75 MG EC tablet; Take 1 tablet (75 mg total) by mouth 2 (two) times daily.  Dispense: 30 tablet; Refill: 0 - methocarbamol (ROBAXIN) 500 MG tablet; Take 2 tablets (1,000 mg total) by mouth every 8 (eight) hours as needed for muscle spasms.  Dispense: 90 tablet; Refill: 0 - Ambulatory referral to Orthopedic Surgery  Last BPs 119/80 and  129/82.  She says up today bc anxious about pap.  [employer] "Keith Rake" interpreters used and additional time performing visit was required.   Return if symptoms worsen or fail to improve.  The patient was given clear instructions to go to ER or return to medical center if symptoms don't improve, worsen or new problems develop. The patient verbalized understanding. The patient was told to call to get lab results if they haven't heard anything in the next week.      Freeman Caldron, PA-C [facility] [place], [place]   04/22/2022, 10:21 AM

## 2022-04-23 LAB — HEMOGLOBIN A1C
Est. average glucose Bld gHb Est-mCnc: 120 mg/dL
Hgb A1c MFr Bld: 5.8 % — ABNORMAL HIGH (ref 4.8–5.6)

## 2022-04-27 LAB — CYTOLOGY - PAP
Comment: NEGATIVE
Diagnosis: NEGATIVE
High risk HPV: NEGATIVE

## 2022-05-05 ENCOUNTER — Other Ambulatory Visit: Payer: Self-pay | Admitting: Obstetrics and Gynecology

## 2022-05-05 DIAGNOSIS — Z1231 Encounter for screening mammogram for malignant neoplasm of breast: Secondary | ICD-10-CM

## 2022-05-08 ENCOUNTER — Ambulatory Visit: Payer: Self-pay | Attending: Internal Medicine

## 2022-06-03 ENCOUNTER — Encounter: Payer: Self-pay | Admitting: Orthopedic Surgery

## 2022-06-03 ENCOUNTER — Ambulatory Visit (INDEPENDENT_AMBULATORY_CARE_PROVIDER_SITE_OTHER): Payer: Self-pay | Admitting: Orthopedic Surgery

## 2022-06-03 ENCOUNTER — Ambulatory Visit (INDEPENDENT_AMBULATORY_CARE_PROVIDER_SITE_OTHER): Payer: Self-pay

## 2022-06-03 DIAGNOSIS — M5416 Radiculopathy, lumbar region: Secondary | ICD-10-CM

## 2022-06-03 NOTE — Progress Notes (Signed)
Office Visit Note   Patient: Tracy Blevins           Date of Birth: February 27, 1976           MRN: [id number] Visit Date: 06/03/2022 Requested by: Argentina Donovan, PA-C [place] [id number] PCP: Ladell Pier, MD  Subjective: Chief Complaint  Patient presents with   Lower Back - Pain    HPI: Tracy Blevins is a 47 y.o. female who presents to the office reporting left-sided back pain radiating down the left leg.  This has been going on for 3 to 4 years.  She had an MRI scan in 2019 and then had a subsequent injury in 2020.  The MRI scan in 2019 showed L5-S1 where central protrusion extends to the left foramen.  The pain does wake the patient from sleep at night.  Reports increased pain with activity.  No groin pain.  Does report some buttock pain.  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  She does have sharp pain on a daily basis.  Takes diclofenac and Robaxin which does not help her but does make her drowsy..                ROS: All systems reviewed are negative as they relate to the chief complaint within the history of present illness.  Patient denies fevers or chills.  Assessment & Plan: Visit Diagnoses:  1. Radiculopathy, lumbar region     Plan: Impression is left-sided back pain with history of central disc protrusion and some irritation of the left sided L5 nerve root at that time.  I think her best bet for relief would be epidural steroid injection x 1 with Dr. Ernestina Patches and starting physical therapy just 1-2 times a week for 1 to 2 weeks to get on a home program about 2 weeks after her back injection.  Does not look like an operative problem.  Follow-Up Instructions: No follow-ups on file.   Orders:  Orders Placed This Encounter  Procedures   XR Lumbar Spine 2-3 Views   No orders of the defined types were placed in this encounter.     Procedures: No procedures performed   Clinical Data: No additional  findings.  Objective: Vital Signs: There were no vitals taken for this visit.  Physical Exam:  Constitutional: Patient appears well-developed HEENT:  Head: Normocephalic Eyes:EOM are normal Neck: Normal range of motion Cardiovascular: Normal rate Pulmonary/chest: Effort normal Neurologic: Patient is alert Skin: Skin is warm Psychiatric: Patient has normal mood and affect  Ortho Exam: Ortho exam demonstrates full active and passive range of motion of knees ankles and hips.  No nerve root tension signs.  No paresthesias L1 S1 bilaterally.  5 out of 5 ankle dorsiflexion plantarflexion quad hamstring strength with negative Minsky negative clonus.  Mild pain with forward lateral bending and no trochanteric tenderness is present.  Specialty Comments:  No specialty comments available.  Imaging: No results found.   PMFS History: Patient Active Problem List   Diagnosis Date Noted   Chronic left-sided low back pain with left-sided sciatica 02/10/2022   Irregular periods 02/10/2022   Influenza vaccine needed 07/18/2020   Need for tetanus, diphtheria, and acellular pertussis (Tdap) vaccine 07/18/2020   Screening breast examination 02/14/2019   Breast pain, right 02/14/2019   Past Medical History:  Diagnosis Date   Headache    Hypothyroidism    S/P appy    S/P tubal ligation  Family History  Problem Relation Age of Onset   Hypertension Mother    Diabetes Mother    Hypertension Father    Uterine cancer Maternal Grandmother     Past Surgical History:  Procedure Laterality Date   APPENDECTOMY  2007   CESAREAN SECTION  2000   CESAREAN SECTION  2008   TUBAL LIGATION  2008   Social History   Occupational History   Not on file  Tobacco Use   Smoking status: Never   Smokeless tobacco: Never  Vaping Use   Vaping Use: Never used  Substance and Sexual Activity   Alcohol use: No   Drug use: No   Sexual activity: Yes    Birth control/protection: Surgical

## 2022-06-03 NOTE — Addendum Note (Signed)
Addended byLaurann Montana on: 06/03/2022 10:29 AM   Modules accepted: Orders

## 2022-06-04 ENCOUNTER — Ambulatory Visit: Payer: Self-pay | Admitting: Hematology and Oncology

## 2022-06-04 ENCOUNTER — Ambulatory Visit
Admission: RE | Admit: 2022-06-04 | Discharge: 2022-06-04 | Disposition: A | Discharge status: Home / Self Care | Payer: No Typology Code available for payment source | Source: Ambulatory Visit | Attending: Obstetrics and Gynecology | Admitting: Obstetrics and Gynecology

## 2022-06-04 VITALS — BP 126/91 | Wt 173.0 lb

## 2022-06-04 DIAGNOSIS — Z1231 Encounter for screening mammogram for malignant neoplasm of breast: Secondary | ICD-10-CM

## 2022-06-04 DIAGNOSIS — Z1239 Encounter for other screening for malignant neoplasm of breast: Secondary | ICD-10-CM

## 2022-06-04 NOTE — Progress Notes (Signed)
Tracy Blevins is a 47 y.o. female who presents to [facility] clinic today with no complaints.    Pap Smear: Pap not smear completed today. Last Pap smear was 2024 and was normal. Per patient has no history of an abnormal Pap smear. Last Pap smear result is available in Epic.   Physical exam: Breasts Breasts symmetrical. No skin abnormalities bilateral breasts. No nipple retraction bilateral breasts. No nipple discharge bilateral breasts. No lymphadenopathy. No lumps palpated bilateral breasts.    MS DIGITAL DIAG TOMO BILAT  Result Date: 02/14/2019 CLINICAL DATA:  Patient describes diffuse intermittent pain within the outer RIGHT breast for approximately 3 months. EXAM: DIGITAL DIAGNOSTIC BILATERAL MAMMOGRAM WITH CAD AND TOMO COMPARISON:  Previous exam(s). ACR Breast Density Category c: The breast tissue is heterogeneously dense, which may obscure small masses. FINDINGS: There are no new dominant masses, suspicious calcifications or secondary signs of malignancy within either breast. Specifically, there is no mammographic abnormality identified within the outer RIGHT breast corresponding to the area of clinical concern. Mammographic images were processed with CAD. IMPRESSION: No evidence of malignancy within either breast. RECOMMENDATION: 1.  Screening mammogram in one year.(Code:SM-B-01Y) 2. Benign causes of breast pain, and possible remedies, were discussed with the patient. Patient was encouraged to follow-up with referring physician if a palpable lump/mass developed. I have discussed the findings and recommendations with the patient. If applicable, a reminder letter will be sent to the patient regarding the next appointment. BI-RADS CATEGORY  1: Negative. Electronically Signed   By: Franki Cabot M.D.   On: 02/14/2019 10:36   MS DIGITAL SCREENING TOMO BILATERAL  Result Date: 12/02/2017 CLINICAL DATA:  Screening. EXAM: DIGITAL SCREENING BILATERAL MAMMOGRAM WITH TOMO AND CAD COMPARISON:   Previous exam(s). ACR Breast Density Category c: The breast tissue is heterogeneously dense, which may obscure small masses. FINDINGS: There are no findings suspicious for malignancy. Images were processed with CAD. IMPRESSION: No mammographic evidence of malignancy. A result letter of this screening mammogram will be mailed directly to the patient. RECOMMENDATION: Screening mammogram in one year. (Code:SM-B-01Y) BI-RADS CATEGORY  1: Negative. Electronically Signed   By: Lajean Manes M.D.   On: 12/02/2017 16:02       Pelvic/Bimanual Pap is not indicated today    Smoking History: Patient has never smoked and was not referred to quit line.    Patient Navigation: Patient education provided. Access to services provided for patient through [place] interpreter provided. No transportation provided   Colorectal Cancer Screening: Per patient has never had colonoscopy completed No complaints today.    Breast and Cervical Cancer Risk Assessment: Patient does not have family history of breast cancer, known genetic mutations, or radiation treatment to the chest before age 65. Patient does not have history of cervical dysplasia, immunocompromised, or DES exposure in-utero.  Risk Assessment   No risk assessment data for the current encounter  Risk Scores       02/14/2019   Last edited by: Loletta Parish, RN   5-year risk: 0.3 %   Lifetime risk: 5.8 %            A: [facility] exam without pap smear No complaints with benign exam.   P: Referred patient to the [facility] for a screening mammogram. Appointment scheduled 06/04/22.  Tracy Ped, NP 06/04/2022 10:13 AM

## 2022-06-04 NOTE — Patient Instructions (Signed)
[place] about self breast awareness and gave educational materials to take home. Patient did not need a Pap smear today due to last Pap smear was in 2024 per patient.  Let her know [facility] will cover Pap smears every 5 years unless has a history of abnormal Pap smears. Referred patient to the [facility] for screening mammogram. Appointment scheduled for 06/04/22. Patient aware of appointment and will be there. Let patient know will follow up with her within the next couple weeks with results. [place] verbalized understanding.  Melodye Ped, NP 10:14 AM

## 2022-06-17 ENCOUNTER — Ambulatory Visit: Payer: Self-pay | Admitting: Physician Assistant

## 2022-06-18 ENCOUNTER — Ambulatory Visit: Payer: Self-pay

## 2022-06-18 ENCOUNTER — Ambulatory Visit: Payer: Self-pay | Attending: Orthopedic Surgery | Admitting: Physical Therapy

## 2022-06-18 ENCOUNTER — Ambulatory Visit (INDEPENDENT_AMBULATORY_CARE_PROVIDER_SITE_OTHER): Payer: Self-pay | Admitting: Physical Medicine and Rehabilitation

## 2022-06-18 VITALS — BP 123/82 | HR 69

## 2022-06-18 DIAGNOSIS — M5416 Radiculopathy, lumbar region: Secondary | ICD-10-CM

## 2022-06-18 MED ORDER — METHYLPREDNISOLONE ACETATE 80 MG/ML IJ SUSP
80.0000 mg | Freq: Once | INTRAMUSCULAR | Status: AC
  Administered 2022-06-18: 80 mg

## 2022-06-18 NOTE — Progress Notes (Signed)
Functional Pain Scale - descriptive words and definitions  Distressing (6)    Pain is present/unable to complete most ADLs limited by pain/sleep is difficult and active distraction is only marginal. Moderate range order  Average Pain 6-9   +Driver, -BT, -Dye Allergies.  Lower back pain on left that radiates into left hip

## 2022-06-18 NOTE — Patient Instructions (Signed)

## 2022-06-18 NOTE — Therapy (Incomplete)
OUTPATIENT PHYSICAL THERAPY THORACOLUMBAR EVALUATION   Patient Name: Tracy Blevins MRN: [id number] DOB:1975-07-11, 47 y.o., female Today's Date: 06/18/2022  END OF SESSION:   Past Medical History:  Diagnosis Date   Headache    Hypothyroidism    S/P appy    S/P tubal ligation    Past Surgical History:  Procedure Laterality Date   APPENDECTOMY  2007   CESAREAN SECTION  2000   CESAREAN SECTION  2008   TUBAL LIGATION  2008   Patient Active Problem List   Diagnosis Date Noted   Chronic left-sided low back pain with left-sided sciatica 02/10/2022   Irregular periods 02/10/2022   Influenza vaccine needed 07/18/2020   Need for tetanus, diphtheria, and acellular pertussis (Tdap) vaccine 07/18/2020   Screening breast examination 02/14/2019   Breast pain, right 02/14/2019    PCP: Ladell Pier, MD  REFERRING PROVIDER: Meredith Pel, MD  REFERRING DIAG: M54.16 (ICD-10-CM) - Radiculopathy, lumbar region  Rationale for Evaluation and Treatment: Rehabilitation  THERAPY DIAG:  No diagnosis found.  ONSET DATE: ***  SUBJECTIVE:                                                                                                                                                                                           SUBJECTIVE STATEMENT: ***  PERTINENT HISTORY:  Unremarkable PMH  PAIN:  Are you having pain: *** Location/description: *** Best-worst over past week: ***  Per eval -  - aggravating factors: *** - Easing factors: ***    PRECAUTIONS: {Therapy precautions:24002}  WEIGHT BEARING RESTRICTIONS: No  FALLS:  Has patient fallen in last 6 months? No  LIVING ENVIRONMENT: Lives with: {[facility] lives with:25569::"lives with their family"} Lives in: {Lives in:[address]70} Stairs: {opstairs:27293} Has following equipment at home: {Assistive devices:23999}  OCCUPATION: ***  PLOF: Independent  PATIENT GOALS: ***  NEXT MD VISIT: today  OBJECTIVE:    DIAGNOSTIC FINDINGS:  06/03/22 XR:  "AP lateral radiographs lumbar spine reviewed.  No spondylolisthesis.  No  compression fractures.  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  Degenerative disc disease present at L5-S1 to a moderate  degree.  The rest of the disc bases are relatively well-maintained."  PATIENT SURVEYS:  FOTO ***  SCREENING FOR RED FLAGS: Unremarkable red flag screening  COGNITION: Overall cognitive status: Within functional limits for tasks assessed     SENSATION: {sensation:27233}  MUSCLE LENGTH: Hamstrings: Right *** deg; Left *** deg Marcello Moores test: Right *** deg; Left *** deg  POSTURE: {posture:25561}  PALPATION: ***  LUMBAR ROM:   AROM eval  Flexion   Extension   Right  lateral flexion   Left lateral flexion   Right rotation   Left rotation    (Blank rows = not tested)  LOWER EXTREMITY ROM:     {AROM/PROM:27142}  Right eval Left eval  Hip flexion    Hip extension    Hip internal rotation    Hip external rotation    Knee extension    Knee flexion    (Blank rows = not tested) (Key: WFL = within functional limits not formally assessed, * = concordant pain, s = stiffness/stretching sensation, NT = not tested)  Comments:    LOWER EXTREMITY MMT:    MMT Right eval Left eval  Hip flexion    Hip abduction (modified sitting)    Hip internal rotation    Hip external rotation    Knee flexion    Knee extension    Ankle dorsiflexion     (Blank rows = not tested) (Key: WFL = within functional limits not formally assessed, * = concordant pain, s = stiffness/stretching sensation, NT = not tested)  Comments:    LUMBAR SPECIAL TESTS:  {lumbar special test:25242}  FUNCTIONAL TESTS:  {Functional tests:24029}  GAIT: Distance walked: within clinic Assistive device utilized: {Assistive devices:23999} Level of assistance: {Levels of assistance:24026} Comments: ***  TODAY'S TREATMENT:                                                                                                                               [facility] Adult PT Treatment:                                                DATE: 06/18/22 Therapeutic Exercise: *** Manual Therapy: *** Neuromuscular re-ed: *** Therapeutic Activity: *** Modalities: *** Self Care: ***   PATIENT EDUCATION:  Education details: Pt education on PT impairments, prognosis, and POC. Informed consent. Rationale for interventions, safe/appropriate HEP performance Person educated: Patient Education method: Explanation, Demonstration, Tactile cues, Verbal cues, and Handouts Education comprehension: verbalized understanding, returned demonstration, verbal cues required, tactile cues required, and needs further education    HOME EXERCISE PROGRAM: ***  ASSESSMENT:  CLINICAL IMPRESSION: Patient is a 47 y.o. woman who was seen today for physical therapy evaluation and treatment for chronic low back pain. ***    OBJECTIVE IMPAIRMENTS: {opptimpairments:25111}.   ACTIVITY LIMITATIONS: {activitylimitations:27494}  PARTICIPATION LIMITATIONS: {participationrestrictions:25113}  PERSONAL FACTORS: {Personal factors:25162} are also affecting patient's functional outcome.   REHAB POTENTIAL: {rehabpotential:25112}  CLINICAL DECISION MAKING: {clinical decision making:25114}  EVALUATION COMPLEXITY: {Evaluation complexity:25115}   GOALS: Goals reviewed with patient? {yes/no:20286}  SHORT TERM GOALS: Target date: ***  Pt will demonstrate appropriate understanding and performance of initially prescribed HEP in order to facilitate improved independence with management of symptoms.  Baseline: HEP provided on eval Goal status: INITIAL   2. Pt will score greater than or equal  to *** on FOTO in order to demonstrate improved perception of function due to symptoms.  Baseline: ***  Goal status: INITIAL    LONG TERM GOALS: Target date: ***  Pt will score *** on FOTO in order to  demonstrate improved perception of functional status due to symptoms.  Baseline: *** Goal status: INITIAL  2.  Pt will demonstrate *** lumbar AROM in order to demonstrate improved tolerance to functional movement patterns.   Baseline: *** Goal status: INITIAL  3.  Pt will demonstrate hip MMT of *** in order to demonstrate improved strength for functional movements.  Baseline: *** Goal status: INITIAL  4. Pt will perform 5xSTS in <*** sec in order to demonstrate reduced fall risk and improved functional independence. (MCID of 2.3sec)  Baseline: ***  Goal status: INITIAL   PLAN:  PT FREQUENCY: {rehab frequency:25116}  PT DURATION: {rehab duration:25117}  PLANNED INTERVENTIONS: {rehab planned interventions:25118::"Therapeutic exercises","Therapeutic activity","Neuromuscular re-education","Balance training","Gait training","Patient/Family education","Self Care","Joint mobilization"}.  PLAN FOR NEXT SESSION: Progress ROM/strengthening exercises as able/appropriate, review HEP.    Leeroy Cha PT, DPT 06/18/2022 8:38 AM

## 2022-06-23 NOTE — Therapy (Signed)
OUTPATIENT PHYSICAL THERAPY THORACOLUMBAR EVALUATION   Patient Name: Tracy Blevins MRN: [id number] DOB:[birth year], 47 y.o., female Today's Date: 06/30/2022  END OF SESSION:  PT End of Session - 06/30/22 0845     Visit Number 1    Number of Visits 17    Date for PT Re-Evaluation 08/25/22    Authorization Type no coverage in chart    Authorization Time Period no auth    PT Start Time 0846    PT Stop Time 0928    PT Time Calculation (min) 42 min    Activity Tolerance Patient tolerated treatment well;No increased pain    Behavior During Therapy [facility] for tasks assessed/performed             Past Medical History:  Diagnosis Date   Headache    Hypothyroidism    S/P appy    S/P tubal ligation    Past Surgical History:  Procedure Laterality Date   APPENDECTOMY  2007   CESAREAN SECTION  2000   CESAREAN SECTION  2008   TUBAL LIGATION  2008   Patient Active Problem List   Diagnosis Date Noted   Chronic left-sided low back pain with left-sided sciatica 02/10/2022   Irregular periods 02/10/2022   Influenza vaccine needed 07/18/2020   Need for tetanus, diphtheria, and acellular pertussis (Tdap) vaccine 07/18/2020   Screening breast examination 02/14/2019   Breast pain, right 02/14/2019    PCP: Ladell Pier, MD  REFERRING PROVIDER: Meredith Pel, MD  REFERRING DIAG: M54.16 (ICD-10-CM) - Radiculopathy, lumbar region  Rationale for Evaluation and Treatment: Rehabilitation  THERAPY DIAG:  Chronic low back pain with left-sided sciatica, unspecified back pain laterality  Muscle weakness (generalized)  Other abnormalities of gait and mobility  ONSET DATE: 15 years ago  SUBJECTIVE:                                                                                                                                                                                           SUBJECTIVE STATEMENT: Appreciate assistance of video interpreter throughout. Pt  states ongoing back pain for past 15 years, states that when she was younger she fell down the stairs and landed on her buttocks.  States she has been seeing a specialist, received imaging and an injection (two weeks ago). Pt states that she hasn't really had much relief from injection, maybe a little.  Occasional symptoms into L LE when symptoms worst (after doing chores), no B symptoms. Denies numbness/tingling, describes as burning for LE symptoms. No saddle anesthesia or bowel/bladder issues (see red flag section). States she usually enjoys being fairly active with her  children, including jumping on trampoline and taking walks but has had difficulty doing this due to her symptoms.    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  No spondylolisthesis.  No  compression fractures.  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  Degenerative disc disease present at L5-S1 to a moderate  degree.  The rest of the disc bases are relatively well-maintained."  PATIENT SURVEYS:  FOTO deferred on eval given time constraints, plan to administer as able/appropriate  SCREENING FOR RED FLAGS: Unremarkable red flag screening - describes  some chronic constipation issues which she states she is being treated for. States about 8 months ago she began to develop more frequency of urination and increased urge, denies any loss of control or sensory issues. Educated on monitoring and appropriate actions  COGNITION: Overall cognitive status: Within functional limits for tasks assessed     SENSATION/NEURO: Light touch intact B LE aside from mildly diminished L S1 (lateral ankle) No clonus BIL LE  Negative hoffmann and tromner sign  No ataxia with gait   POSTURE: grossly WNL   LUMBAR ROM:   AROM eval  Flexion   Extension   Right lateral flexion   Left lateral flexion   Right rotation seated 75% L QL pain  Left rotation seated 50% *   (Blank rows = not tested) (Key: WFL = within functional limits not formally assessed, * = concordant pain, s = stiffness/stretching sensation, NT = not tested)   LOWER EXTREMITY ROM:     Active  Right eval Left eval  Hip flexion    Hip extension    Hip internal rotation    Hip external rotation    Knee extension    Knee flexion    (Blank rows = not tested) (Key: WFL = within functional limits not formally assessed, * = concordant pain, s = stiffness/stretching sensation, NT = not tested)  Comments:    LOWER EXTREMITY MMT:    MMT Right eval Left eval  Hip flexion 4 3+ *  Hip abduction (modified sitting) 4 4  Hip internal rotation    Hip external rotation    Knee flexion 4 4-   Knee extension 4 4- *  Ankle dorsiflexion 4 4   (Blank rows = not tested) (Key: WFL = within functional limits not formally assessed, * = concordant pain, s = stiffness/stretching sensation, NT = not tested)  Comments:    LUMBAR SPECIAL TESTS:  Slump test: + BIL, L more positive than R (pulling in low back on R, back pain + LE pain on L)  FUNCTIONAL TESTS:  5xSTS: 24.96 sec gentle UE support, increased low back pain  GAIT: Distance walked: within clinic Assistive device utilized:  None Level  of assistance: Complete Independence Comments: gait mechanics grossly WNL, mildly reduced gait speed and cadence   TODAY'S TREATMENT:                                                                                                                              [facility] Adult PT Treatment:                                                DATE: 06/30/22 Deferred on eval given increased time w/ pt discussion/education  PATIENT EDUCATION:  Education details: Pt education on PT impairments, prognosis, and POC. Informed consent. Rationale for interventions. Monitoring symptoms and appropriate action  Person educated: Patient Education method: Explanation, Demonstration, Tactile cues, Verbal cues Education comprehension: verbalized understanding, returned demonstration, verbal cues required, tactile cues required, and needs further education    HOME EXERCISE PROGRAM: Deferred on eval  ASSESSMENT:  CLINICAL IMPRESSION: Patient is a pleasant 47 y.o. woman who was seen today for physical therapy evaluation and treatment for chronic low back pain with LLE referral. 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 Red flag screening reassuring, see above, education provided. On exam concordant pain is elicited with lumbar rotation ROM and knee/hip MMT, mild sensory deficits S1 dermatome and + slump BIL (L more so than R). 5xSTS time indicative of fall risk. Pt endorses improved symptoms after examination, no adverse events. At present would recommend skilled PT to address aforementioned deficits to maximize functional independence. Pt verbalizes agreement/understanding with plan. Pt departs today's session in no acute distress, all voiced questions/concerns addressed appropriately from PT perspective.       OBJECTIVE IMPAIRMENTS: decreased activity tolerance, decreased endurance, decreased mobility,  difficulty walking, decreased ROM, decreased strength, hypomobility, and pain.   ACTIVITY LIMITATIONS: carrying, lifting, bending, sitting, standing, squatting, stairs, transfers, locomotion level, and caring for others  PARTICIPATION LIMITATIONS: meal prep, cleaning, laundry, driving, community activity, occupation, and yard work  PERSONAL FACTORS: Time since onset of injury/illness/exacerbation are also affecting patient's functional outcome.   REHAB POTENTIAL: Good  CLINICAL DECISION MAKING: Evolving/moderate complexity  EVALUATION COMPLEXITY: Moderate   GOALS: Goals reviewed with patient? No  SHORT TERM GOALS: Target date: 07/28/2022  Pt will demonstrate appropriate understanding and performance of initially prescribed HEP in order to facilitate improved independence with management of symptoms.  Baseline: HEP provided on eval Goal status: INITIAL   2. Pt will score greater than or equal to MCID on FOTO in order to demonstrate improved perception of function due to symptoms.  Baseline: Deferred on eval, will assess as able/appropriate  Goal status: INITIAL    LONG TERM GOALS: Target date: 08/25/2022    Pt will meet predicted score on FOTO in order to  demonstrate improved perception of functional status due to symptoms.  Baseline: deferred on eval, will assess as able/appropriate Goal status: INITIAL  2.  Pt will demonstrate grossly symmetrical lumbar rotation AROM with less than 2 pt increase in pain on NPS in order to demonstrate improved tolerance to functional movement patterns.  Baseline: see ROM chart above Goal status: INITIAL  3.  Pt will demonstrate improvement in global LLE MMT of at least 1 pt in order to demonstrate improved strength for functional movements.  Baseline: see MMT chart above Goal status: INITIAL  4. Pt will perform 5xSTS in </= 14 sec in order to demonstrate reduced fall risk and improved functional independence. (MCID of 2.3sec)  Baseline: 24.96  sec with gentle UE support and increase in pain  Goal status: INITIAL   5. Pt will endorse overall pain reduction of at least 50% for improved tolerance to daily activities.   Baseline: 5-8/10 on NPS  Goal status: INITIAL  6. Pt will be independent w/ final prescribed HEP in order to promote improved self management of symptoms.   Baseline: TBD  Goal status: INITIAL   PLAN:  PT FREQUENCY: 2x/week  PT DURATION: 8 weeks  PLANNED INTERVENTIONS: Therapeutic exercises, Therapeutic activity, Neuromuscular re-education, Balance training, Gait training, Patient/Family education, Self Care, Joint mobilization, Joint manipulation, Stair training, Aquatic Therapy, Dry Needling, Electrical stimulation, Spinal manipulation, Spinal mobilization, Cryotherapy, Moist heat, Taping, Manual therapy, and Re-evaluation.  PLAN FOR NEXT SESSION: establish HEP (basic core activation, consider nerve glides). Look at palpation, manual PRN as indicated. FOTO    Leeroy Cha PT, DPT 06/30/2022 9:55 AM

## 2022-06-23 NOTE — Procedures (Signed)
Lumbosacral Transforaminal Epidural Steroid Injection - Sub-Pedicular Approach with Fluoroscopic Guidance  Patient: Tracy Blevins      Date of Birth: February 09, 1976 MRN: [id number] PCP: Ladell Pier, MD      Visit Date: 06/18/2022   Universal Protocol:    Date/Time: 06/18/2022  Consent Given By: the patient  Position: PRONE  Additional Comments: Vital signs were monitored before and after the procedure. Patient was prepped and draped in the usual sterile fashion. The correct patient, procedure, and site was verified.   Injection Procedure Details:   Procedure diagnoses: Lumbar radiculopathy [M54.16]    Meds Administered:  Meds ordered this encounter  Medications   methylPREDNISolone acetate (DEPO-MEDROL) injection 80 mg    Laterality: Left  Location/Site: L4  Needle:5.0 in., 22 ga.  Short bevel or Quincke spinal needle  Needle Placement: Transforaminal  Findings:    -Comments: Excellent flow of contrast along the nerve, nerve root and into the epidural space.  Procedure Details: After squaring off the end-plates to get a true AP view, the C-arm was positioned so that an oblique view of the foramen as noted above was visualized. The target area is just inferior to the "nose of the scotty dog" or sub pedicular. The soft tissues overlying this structure were infiltrated with 2-3 ml. of 1% Lidocaine without Epinephrine.  The spinal needle was inserted toward the target using a "trajectory" view along the fluoroscope beam.  Under AP and lateral visualization, the needle was advanced so it did not puncture dura and was located close the 6 O'Clock position of the pedical in AP tracterory. Biplanar projections were used to confirm position. Aspiration was confirmed to be negative for CSF and/or blood. A 1-2 ml. volume of Isovue-250 was injected and flow of contrast was noted at each level. Radiographs were obtained for documentation purposes.   After attaining the  desired flow of contrast documented above, a 0.5 to 1.0 ml test dose of 0.25% Marcaine was injected into each respective transforaminal space.  The patient was observed for 90 seconds post injection.  After no sensory deficits were reported, and normal lower extremity motor function was noted,   the above injectate was administered so that equal amounts of the injectate were placed at each foramen (level) into the transforaminal epidural space.   Additional Comments:  No complications occurred Dressing: 2 x 2 sterile gauze and Band-Aid    Post-procedure details: Patient was observed during the procedure. Post-procedure instructions were reviewed.  Patient left the clinic in stable condition.

## 2022-06-23 NOTE — Progress Notes (Signed)
Tracy Blevins - 47 y.o. female MRN [id number]  Date of birth: Mar 28, 1976  Office Visit Note: Visit Date: 06/18/2022 PCP: Ladell Pier, MD Referred by: Ladell Pier, MD  Subjective: Chief Complaint  Patient presents with   Lower Back - Pain   HPI:  Tracy Blevins is a 47 y.o. female who comes in today at the request of Dr. Anderson Malta for planned Left L4-5 Lumbar Transforaminal epidural steroid injection with fluoroscopic guidance.  The patient has failed conservative care including home exercise, medications, time and activity modification.  This injection will be diagnostic and hopefully therapeutic.  Please see requesting physician notes for further details and justification.   ROS Otherwise per HPI.  Assessment & Plan: Visit Diagnoses:    ICD-10-CM   1. Lumbar radiculopathy  M54.16 XR C-ARM NO REPORT    Epidural Steroid injection    methylPREDNISolone acetate (DEPO-MEDROL) injection 80 mg      Plan: No additional findings.   Meds & Orders:  Meds ordered this encounter  Medications   methylPREDNISolone acetate (DEPO-MEDROL) injection 80 mg    Orders Placed This Encounter  Procedures   XR C-ARM NO REPORT   Epidural Steroid injection    Follow-up: Return for visit to requesting provider as needed.   Procedures: No procedures performed  Lumbosacral Transforaminal Epidural Steroid Injection - Sub-Pedicular Approach with Fluoroscopic Guidance  Patient: Tracy Blevins      Date of Birth: 07/30/1975 MRN: [id number] PCP: Ladell Pier, MD      Visit Date: 06/18/2022   Universal Protocol:    Date/Time: 06/18/2022  Consent Given By: the patient  Position: PRONE  Additional Comments: Vital signs were monitored before and after the procedure. Patient was prepped and draped in the usual sterile fashion. The correct patient, procedure, and site was verified.   Injection Procedure Details:   Procedure diagnoses: Lumbar  radiculopathy [M54.16]    Meds Administered:  Meds ordered this encounter  Medications   methylPREDNISolone acetate (DEPO-MEDROL) injection 80 mg    Laterality: Left  Location/Site: L4  Needle:5.0 in., 22 ga.  Short bevel or Quincke spinal needle  Needle Placement: Transforaminal  Findings:    -Comments: Excellent flow of contrast along the nerve, nerve root and into the epidural space.  Procedure Details: After squaring off the end-plates to get a true AP view, the C-arm was positioned so that an oblique view of the foramen as noted above was visualized. The target area is just inferior to the "nose of the scotty dog" or sub pedicular. The soft tissues overlying this structure were infiltrated with 2-3 ml. of 1% Lidocaine without Epinephrine.  The spinal needle was inserted toward the target using a "trajectory" view along the fluoroscope beam.  Under AP and lateral visualization, the needle was advanced so it did not puncture dura and was located close the 6 O'Clock position of the pedical in AP tracterory. Biplanar projections were used to confirm position. Aspiration was confirmed to be negative for CSF and/or blood. A 1-2 ml. volume of Isovue-250 was injected and flow of contrast was noted at each level. Radiographs were obtained for documentation purposes.   After attaining the desired flow of contrast documented above, a 0.5 to 1.0 ml test dose of 0.25% Marcaine was injected into each respective transforaminal space.  The patient was observed for 90 seconds post injection.  After no sensory deficits were reported, and normal lower extremity motor function was noted,  the above injectate was administered so that equal amounts of the injectate were placed at each foramen (level) into the transforaminal epidural space.   Additional Comments:  No complications occurred Dressing: 2 x 2 sterile gauze and Band-Aid    Post-procedure details: Patient was observed during the  procedure. Post-procedure instructions were reviewed.  Patient left the clinic in stable condition.    Clinical History: No specialty comments available.     Objective:  VS:  HT:    WT:   BMI:     BP:123/82  HR:69bpm  TEMP: ( )  RESP:  Physical Exam Vitals and nursing note reviewed.  Constitutional:      General: She is not in acute distress.    Appearance: Normal appearance. She is not ill-appearing.  HENT:     Head: Normocephalic and atraumatic.     Right Ear: External ear normal.     Left Ear: External ear normal.  Eyes:     Extraocular Movements: Extraocular movements intact.  Cardiovascular:     Rate and Rhythm: Normal rate.     Pulses: Normal pulses.  Pulmonary:     Effort: Pulmonary effort is normal. No respiratory distress.  Abdominal:     General: There is no distension.     Palpations: Abdomen is soft.  Musculoskeletal:        General: Tenderness present.     Cervical back: Neck supple.     Right lower leg: No edema.     Left lower leg: No edema.     Comments: Patient has good distal strength with no pain over the greater trochanters.  No clonus or focal weakness.  Skin:    Findings: No erythema, lesion or rash.  Neurological:     General: No focal deficit present.     Mental Status: She is alert and oriented to person, place, and time.     Sensory: No sensory deficit.     Motor: No weakness or abnormal muscle tone.     Coordination: Coordination normal.  Psychiatric:        Mood and Affect: Mood normal.        Behavior: Behavior normal.      Imaging: No results found.

## 2022-06-30 ENCOUNTER — Other Ambulatory Visit: Payer: Self-pay

## 2022-06-30 ENCOUNTER — Ambulatory Visit: Payer: Self-pay | Attending: Orthopedic Surgery | Admitting: Physical Therapy

## 2022-06-30 ENCOUNTER — Encounter: Payer: Self-pay | Admitting: Physical Therapy

## 2022-06-30 DIAGNOSIS — G8929 Other chronic pain: Secondary | ICD-10-CM | POA: Insufficient documentation

## 2022-06-30 DIAGNOSIS — M5442 Lumbago with sciatica, left side: Secondary | ICD-10-CM | POA: Insufficient documentation

## 2022-06-30 DIAGNOSIS — M6281 Muscle weakness (generalized): Secondary | ICD-10-CM | POA: Insufficient documentation

## 2022-06-30 DIAGNOSIS — R2689 Other abnormalities of gait and mobility: Secondary | ICD-10-CM | POA: Insufficient documentation

## 2022-06-30 DIAGNOSIS — M5416 Radiculopathy, lumbar region: Secondary | ICD-10-CM | POA: Insufficient documentation

## 2022-07-14 ENCOUNTER — Ambulatory Visit: Payer: Self-pay | Admitting: Physical Therapy

## 2022-07-14 ENCOUNTER — Encounter: Payer: Self-pay | Admitting: Physical Therapy

## 2022-07-14 DIAGNOSIS — G8929 Other chronic pain: Secondary | ICD-10-CM

## 2022-07-14 DIAGNOSIS — M6281 Muscle weakness (generalized): Secondary | ICD-10-CM

## 2022-07-14 DIAGNOSIS — R2689 Other abnormalities of gait and mobility: Secondary | ICD-10-CM

## 2022-07-14 NOTE — Therapy (Signed)
OUTPATIENT PHYSICAL THERAPY TREATMENT NOTE   Patient Name: Tracy Blevins MRN: [id number] DOB:November 24, 1975, 47 y.o., female Today's Date: 07/14/2022  PCP: Ladell Pier, MD  REFERRING PROVIDER: Meredith Pel, MD   END OF SESSION:   PT End of Session - 07/14/22 1102     Visit Number 2    Number of Visits 17    Date for PT Re-Evaluation 08/25/22    Authorization Type CA[employer] 100%    Authorization Time Period 05/08/22 to 11/06/22    PT Start Time 1100    PT Stop Time 1145    PT Time Calculation (min) 45 min    Activity Tolerance Patient tolerated treatment well;No increased pain    Behavior During Therapy WFL for tasks assessed/performed             Past Medical History:  Diagnosis Date   Headache    Hypothyroidism    S/P appy    S/P tubal ligation    Past Surgical History:  Procedure Laterality Date   APPENDECTOMY  2007   CESAREAN SECTION  2000   CESAREAN SECTION  2008   TUBAL LIGATION  2008   Patient Active Problem List   Diagnosis Date Noted   Chronic left-sided low back pain with left-sided sciatica 02/10/2022   Irregular periods 02/10/2022   Influenza vaccine needed 07/18/2020   Need for tetanus, diphtheria, and acellular pertussis (Tdap) vaccine 07/18/2020   Screening breast examination 02/14/2019   Breast pain, right 02/14/2019    REFERRING DIAG: Radiculopathy, lumbar region   THERAPY DIAG:  Chronic low back pain with left-sided sciatica, unspecified back pain laterality  Muscle weakness (generalized)  Other abnormalities of gait and mobility  Rationale for Evaluation and Treatment Rehabilitation  PERTINENT HISTORY: Unremarkable PMH   PRECAUTIONS: none  SUBJECTIVE:                                                                                                                                                                                      SUBJECTIVE STATEMENT:  I am about a 5/10 today.  I can try the TPDN today. Appreciate  assistance of video interpreter throughout. Pt states ongoing back pain for past 15 years, states that when she was younger she fell down the stairs and landed on her buttocks.  States she has been seeing a specialist, received imaging and an injection (two weeks ago). Pt states that she hasn't really had much relief from injection, maybe a little.  Occasional symptoms into L LE when symptoms worst (after doing chores), no B symptoms. Denies numbness/tingling, describes as burning for LE symptoms. No saddle anesthesia or bowel/bladder issues (see red  flag section). States she usually enjoys being fairly active with her children, including jumping on trampoline and taking walks but has had difficulty doing this due to her symptoms.   PAIN:  Are you having pain: no back pain, 5/10 LLE pain  Location/description: low back, SIJ; sometimes refers into L LE to toes (laterally) Best-worst over past week: 5-8/10 Per eval -  - aggravating factors: driving, cleaning her home, sitting positioning, standing/walking >1hr, lying on L side - Easing factors: resting/lying down     OBJECTIVE: (objective measures completed at initial evaluation unless otherwise dated)  DIAGNOSTIC FINDINGS:  06/03/22 XR:  "AP lateral radiographs lumbar spine reviewed.  No spondylolisthesis.  No  compression fractures.  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  Degenerative disc disease present at L5-S1 to a moderate  degree.  The rest of the disc bases are relatively well-maintained."   PATIENT SURVEYS:  FOTO deferred on eval given time constraints, plan to administer as able/appropriate  07-14-22  FOTO 48% predicted 64% SCREENING FOR RED FLAGS: Unremarkable red flag screening - describes some chronic constipation issues which she states she is being treated for. States about 8 months ago she began to develop more frequency of urination and increased urge, denies any loss of control or sensory issues.  Educated on monitoring and appropriate actions   COGNITION: Overall cognitive status: Within functional limits for tasks assessed                          SENSATION/NEURO: Light touch intact B LE aside from mildly diminished L S1 (lateral ankle) No clonus BIL LE  Negative hoffmann and tromner sign  No ataxia with gait     POSTURE: grossly WNL     LUMBAR ROM:    AROM eval  Flexion    Extension    Right lateral flexion    Left lateral flexion    Right rotation seated 75% L QL pain  Left rotation seated 50% *   (Blank rows = not tested) (Key: WFL = within functional limits not formally assessed, * = concordant pain, s = stiffness/stretching sensation, NT = not tested)    LOWER EXTREMITY ROM:      Active  Right eval Left eval  Hip flexion      Hip extension      Hip internal rotation      Hip external rotation      Knee extension      Knee flexion      (Blank rows = not tested) (Key: WFL = within functional limits not formally assessed, * = concordant pain, s = stiffness/stretching sensation, NT = not tested)  Comments:     LOWER EXTREMITY MMT:     MMT Right eval Left eval  Hip flexion 4 3+ *  Hip abduction (modified sitting) 4 4  Hip internal rotation      Hip external rotation      Knee flexion 4 4-   Knee extension 4 4- *  Ankle dorsiflexion 4 4    (Blank rows = not tested) (Key: WFL = within functional limits not formally assessed, * = concordant pain, s = stiffness/stretching sensation, NT = not tested)  Comments:     LUMBAR SPECIAL TESTS:  Slump test: + BIL, L more positive than R (pulling in low back on R, back pain + LE pain on L)   FUNCTIONAL TESTS:  5xSTS: 24.96 sec gentle UE  support, increased low back pain   GAIT: Distance walked: within clinic Assistive device utilized: None Level of assistance: Complete Independence Comments: gait mechanics grossly WNL, mildly reduced gait speed and cadence    TODAY'S TREATMENT:      [facility] Adult PT  Treatment:                                                DATE: 07-14-22 Therapeutic Exercise: SKTC 5 x 10 sec on RT and LT each Pelvic tilt x 30 LTR 5 x 20 sec each RT and LT Bridge 3 x 10 STS with 15 # KB with VC and TC Side bend with 15 # KB 10 x on  Manual Therapy: Trigger Point Dry Needling Treatment: Pre-treatment instruction: Patient instructed on dry needling rationale, procedures, and possible side effects including pain during treatment (achy,cramping feeling), bruising, drop of blood, lightheadedness, nausea, sweating. Patient Consent Given: Yes Education handout provided: Yes Muscles treated: LT QL, Lumbar LT L3 to L5  Needle size and number: .30x37mm x 2 and .30x33mm x 2 Electrical stimulation performed: No Parameters: N/A Treatment response/outcome: Twitch response elicited and Palpable decrease in muscle tension Post-treatment instructions: Patient instructed to expect possible mild to moderate muscle soreness later today and/or tomorrow. Patient instructed in methods to reduce muscle soreness and to continue prescribed HEP.  Patient was also educated on signs and symptoms of infection and to seek medical attention should they occur. Patient verbalized understanding of these instructions and education.   Modalities: Moist hot back concurrent with exercise                                                                                                                        [facility] Adult PT Treatment:                                                DATE: 06/30/22 Deferred on eval given increased time w/ pt discussion/education   PATIENT EDUCATION:  Education details: Pt education on PT impairments, prognosis, and POC. Informed consent. Rationale for interventions. Monitoring symptoms and appropriate action  Person educated: Patient Education method: Explanation, Demonstration, Tactile cues, Verbal cues Education comprehension: verbalized understanding, returned demonstration,  verbal cues required, tactile cues required, and needs further education     HOME EXERCISE PROGRAM: Deferred on eval  Access Code: [email or web address] URL: [email or web address] Date: 07/14/2022 Prepared by: Voncille Lo  Exercises - Supine Pelvic Tilt  - 1 x daily - 7 x weekly - 3 sets - 10 reps - Supine Single Knee to Chest Stretch  - 1 x daily - 7 x weekly - 1 sets - 5 reps - 10 sec hold hold - Supine Lower Trunk Rotation  -  1 x daily - 7 x weekly - 1 sets - 5 reps - 20 sec hold hold - Supine Bridge  - 1 x daily - 7 x weekly - 3 sets - 10 reps - Squat with Chair Touch  - 1 x daily - 7 x weekly - 3 sets - 10 reps ASSESSMENT:   CLINICAL IMPRESSION:  Katherine returns for 2nd visit and consents to [facility]. Pt is closely monitored throughout session.  Pt with reduced pain from 5/10 to 2/10 after TPDN and is able to perform HEP with ease. PT FOTO 46% today before RX. Pt issued initial HEP and reviewed today.  EVAL- Patient is a pleasant 47 y.o. woman who was seen today for physical therapy evaluation and treatment for chronic low back pain with LLE referral. 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 Red flag screening reassuring, see above, education provided. On exam concordant pain is elicited with lumbar rotation ROM and knee/hip MMT, mild sensory deficits S1 dermatome and + slump BIL (L more so than R). 5xSTS time indicative of fall risk. Pt endorses improved symptoms after examination, no adverse events. At present would recommend skilled PT to address aforementioned deficits to maximize functional independence. Pt verbalizes agreement/understanding with plan. Pt departs today's session in no acute distress, all voiced questions/concerns addressed appropriately from PT perspective.        OBJECTIVE IMPAIRMENTS: decreased activity tolerance, decreased endurance, decreased  mobility, difficulty walking, decreased ROM, decreased strength, hypomobility, and pain.    ACTIVITY LIMITATIONS: carrying, lifting, bending, sitting, standing, squatting, stairs, transfers, locomotion level, and caring for others   PARTICIPATION LIMITATIONS: meal prep, cleaning, laundry, driving, community activity, occupation, and yard work   PERSONAL FACTORS: Time since onset of injury/illness/exacerbation are also affecting patient's functional outcome.    REHAB POTENTIAL: Good   CLINICAL DECISION MAKING: Evolving/moderate complexity   EVALUATION COMPLEXITY: Moderate     GOALS: Goals reviewed with patient? No   SHORT TERM GOALS: Target date: 07/28/2022   Pt will demonstrate appropriate understanding and performance of initially prescribed HEP in order to facilitate improved independence with management of symptoms.  Baseline: HEP provided on eval Goal status: INITIAL    2. Pt will score greater than or equal to MCID on FOTO in order to demonstrate improved perception of function due to symptoms.            Baseline: Deferred on eval, will assess as able/appropriate            Goal status: INITIAL     LONG TERM GOALS: Target date: 08/25/2022     Pt will meet predicted score on FOTO in order to demonstrate improved perception of functional status due to symptoms.  Baseline: deferred on eval, will assess as able/appropriate Goal status: INITIAL   2.  Pt will demonstrate grossly symmetrical lumbar rotation AROM with less than 2 pt increase in pain on NPS in order to demonstrate improved tolerance to functional movement patterns.  Baseline: see ROM chart above Goal status: INITIAL   3.  Pt will demonstrate improvement in global LLE MMT of at least 1 pt in order to demonstrate improved strength for functional movements.  Baseline: see MMT chart above Goal status: INITIAL   4. Pt will perform 5xSTS in </= 14 sec in order to demonstrate reduced fall risk and improved functional  independence. (MCID of 2.3sec)  Baseline: 24.96 sec with gentle UE support and increase in pain            Goal status: INITIAL    5. Pt will endorse overall pain reduction of at least 50% for improved tolerance to daily activities.             Baseline: 5-8/10 on NPS            Goal status: INITIAL   6. Pt will be independent w/ final prescribed HEP in order to promote improved self management of symptoms.             Baseline: TBD            Goal status: INITIAL    PLAN:   PT FREQUENCY: 2x/week   PT DURATION: 8 weeks   PLANNED INTERVENTIONS: Therapeutic exercises, Therapeutic activity, Neuromuscular re-education, Balance training, Gait training, Patient/Family education, Self Care, Joint mobilization, Joint manipulation, Stair training, Aquatic Therapy, Dry Needling, Electrical stimulation, Spinal manipulation, Spinal mobilization, Cryotherapy, Moist heat, Taping, Manual therapy, and Re-evaluation.   PLAN FOR NEXT SESSION: establish HEP (basic core activation, consider nerve glides). Look at palpation, manual PRN as indicated. Gust Brooms, PT, [place] Certified Exercise Expert for the Aging Adult  07/14/22 1:58 PM Phone: [phone] Fax: [phone]

## 2022-07-14 NOTE — Patient Instructions (Addendum)
Trigger Point Dry Needling  What is Trigger Point Dry Needling (DN)? DN is a physical therapy technique used to treat muscle pain and dysfunction. Specifically, DN helps deactivate muscle trigger points (muscle knots).  A thin filiform needle is used to penetrate the skin and stimulate the underlying trigger point. The goal is for a local twitch response (LTR) to occur and for the trigger point to relax. No medication of any kind is injected during the procedure.   What Does Trigger Point Dry Needling Feel Like?  The procedure feels different for each individual patient. Some patients report that they do not actually feel the needle enter the skin and overall the process is not painful. Very mild bleeding may occur. However, many patients feel a deep cramping in the muscle in which the needle was inserted. This is the local twitch response.   How Will I feel after the treatment? Soreness is normal, and the onset of soreness may not occur for a few hours. Typically this soreness does not last longer than two days.  Bruising is uncommon, however; ice can be used to decrease any possible bruising.  In rare cases feeling tired or nauseous after the treatment is normal. In addition, your symptoms may get worse before they get better, this period will typically not last longer than 24 hours.   What Can I do After My Treatment? Increase your hydration by drinking more water for the next 24 hours. You may place ice or heat on the areas treated that have become sore, however, do not use heat on inflamed or bruised areas. Heat often brings more relief post needling. You can continue your regular activities, but vigorous activity is not recommended initially after the treatment for 24 hours. DN is best combined with other physical therapy such as strengthening, stretching, and other therapies.     Access Code: [email or web address] URL: [email or web address] Date: 07/14/2022 Prepared by: Voncille Lo  Exercises - Supine Pelvic Tilt  - 1 x daily - 7 x weekly - 3 sets - 10 reps - Supine Single Knee to Chest Stretch  - 1 x daily - 7 x weekly - 1 sets - 5 reps - 10 sec hold hold - Supine Lower Trunk Rotation  - 1 x daily - 7 x weekly - 1 sets - 5 reps - 20 sec hold hold - Supine Bridge  - 1 x daily - 7 x weekly - 3 sets - 10 reps - Squat with Chair Touch  - 1 x daily - 7 x weekly - 3 sets - 10 reps Voncille Lo, PT, [place] Certified Exercise Expert for the Aging Adult  07/14/22 11:07 AM Phone: [phone] Fax: [phone]

## 2022-07-14 NOTE — Therapy (Signed)
OUTPATIENT PHYSICAL THERAPY TREATMENT NOTE   Patient Name: Tracy Blevins MRN: [id number] DOB:1975/07/30, 47 y.o., female Today's Date: 07/15/2022  PCP: Ladell Pier, MD  REFERRING PROVIDER: Meredith Pel, MD   END OF SESSION:   PT End of Session - 07/15/22 1501     Visit Number 3    Number of Visits 17    Date for PT Re-Evaluation 08/25/22    Authorization Type [employer] 100%    Authorization Time Period 05/08/22 to 11/06/22    PT Start Time 1501    PT Stop Time 1540    PT Time Calculation (min) 39 min    Activity Tolerance Patient tolerated treatment well;No increased pain    Behavior During Therapy WFL for tasks assessed/performed              Past Medical History:  Diagnosis Date   Headache    Hypothyroidism    S/P appy    S/P tubal ligation    Past Surgical History:  Procedure Laterality Date   APPENDECTOMY  2007   CESAREAN SECTION  2000   CESAREAN SECTION  2008   TUBAL LIGATION  2008   Patient Active Problem List   Diagnosis Date Noted   Chronic left-sided low back pain with left-sided sciatica 02/10/2022   Irregular periods 02/10/2022   Influenza vaccine needed 07/18/2020   Need for tetanus, diphtheria, and acellular pertussis (Tdap) vaccine 07/18/2020   Screening breast examination 02/14/2019   Breast pain, right 02/14/2019    REFERRING DIAG: Radiculopathy, lumbar region   THERAPY DIAG:  Chronic low back pain with left-sided sciatica, unspecified back pain laterality  Muscle weakness (generalized)  Other abnormalities of gait and mobility  Rationale for Evaluation and Treatment Rehabilitation  PERTINENT HISTORY: Unremarkable PMH   PRECAUTIONS: none  SUBJECTIVE:                                                                                                                                                                                      SUBJECTIVE STATEMENT:  07/15/2022 Pt states needling was helpful yesterday. Politely  declines interpreter today, no apparent communication issues with this Probation officer. Feels pretty sore and tired but less pain.   Per eval - Appreciate assistance of video interpreter throughout. Pt states ongoing back pain for past 15 years, states that when she was younger she fell down the stairs and landed on her buttocks.  States she has been seeing a specialist, received imaging and an injection (two weeks ago). Pt states that she hasn't really had much relief from injection, maybe a little.  Occasional symptoms into L LE when symptoms worst (after doing chores),  no B symptoms. Denies numbness/tingling, describes as burning for LE symptoms. No saddle anesthesia or bowel/bladder issues (see red flag section). States she usually enjoys being fairly active with her children, including jumping on trampoline and taking walks but has had difficulty doing this due to her symptoms.   PAIN:  Are you having pain: 3-4/10 pain at present Location/description: low back, SIJ; sometimes refers into L LE to toes (laterally) Best-worst over past week: 5-8/10 Per eval -  - aggravating factors: driving, cleaning her home, sitting positioning, standing/walking >1hr, lying on L side - Easing factors: resting/lying down     OBJECTIVE: (objective measures completed at initial evaluation unless otherwise dated)  DIAGNOSTIC FINDINGS:  06/03/22 XR:  "AP lateral radiographs lumbar spine reviewed.  No spondylolisthesis.  No  compression fractures.  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  Degenerative disc disease present at L5-S1 to a moderate  degree.  The rest of the disc bases are relatively well-maintained."   PATIENT SURVEYS:  FOTO deferred on eval given time constraints, plan to administer as able/appropriate  07-14-22  FOTO 48% predicted 64% SCREENING FOR RED FLAGS: Unremarkable red flag screening - describes some chronic constipation issues which she states she is being treated for.  States about 8 months ago she began to develop more frequency of urination and increased urge, denies any loss of control or sensory issues. Educated on monitoring and appropriate actions   COGNITION: Overall cognitive status: Within functional limits for tasks assessed                          SENSATION/NEURO: Light touch intact B LE aside from mildly diminished L S1 (lateral ankle) No clonus BIL LE  Negative hoffmann and tromner sign  No ataxia with gait     POSTURE: grossly WNL     LUMBAR ROM:    AROM eval  Flexion    Extension    Right lateral flexion    Left lateral flexion    Right rotation seated 75% L QL pain  Left rotation seated 50% *   (Blank rows = not tested) (Key: WFL = within functional limits not formally assessed, * = concordant pain, s = stiffness/stretching sensation, NT = not tested)    LOWER EXTREMITY ROM:      Active  Right eval Left eval  Hip flexion      Hip extension      Hip internal rotation      Hip external rotation      Knee extension      Knee flexion      (Blank rows = not tested) (Key: WFL = within functional limits not formally assessed, * = concordant pain, s = stiffness/stretching sensation, NT = not tested)  Comments:     LOWER EXTREMITY MMT:     MMT Right eval Left eval  Hip flexion 4 3+ *  Hip abduction (modified sitting) 4 4  Hip internal rotation      Hip external rotation      Knee flexion 4 4-   Knee extension 4 4- *  Ankle dorsiflexion 4 4    (Blank rows = not tested) (Key: WFL = within functional limits not formally assessed, * = concordant pain, s = stiffness/stretching sensation, NT = not tested)  Comments:     LUMBAR SPECIAL TESTS:  Slump test: + BIL, L more positive than R (pulling in low back on R, back  pain + LE pain on L)   FUNCTIONAL TESTS:  5xSTS: 24.96 sec gentle UE support, increased low back pain   GAIT: Distance walked: within clinic Assistive device utilized: None Level of assistance:  Complete Independence Comments: gait mechanics grossly WNL, mildly reduced gait speed and cadence    TODAY'S TREATMENT:      [facility] Adult PT Treatment:                                                DATE: 07/15/22 Therapeutic Exercise: SKTC 5x10 sec R and L cues for breath control  Pelvic tilts 2x10 cues for reduced compensations  Seated adductor iso 2x12 cues for comfortable ROM and posture 3 way swiss ball rollout x8 each way cues for comfortable ROM  Suitcase carry 10# 3x66ft BIL cues for posture and pacing Seated marches 2x8 BIL cues for form and reduced compensations at trunk  Discussed HEP performance   [facility] Adult PT Treatment:                                                DATE: 07-14-22 Therapeutic Exercise: SKTC 5 x 10 sec on RT and LT each Pelvic tilt x 30 LTR 5 x 20 sec each RT and LT Bridge 3 x 10 STS with 15 # KB with VC and TC Side bend with 15 # KB 10 x on  Manual Therapy: Trigger Point Dry Needling Treatment: Pre-treatment instruction: Patient instructed on dry needling rationale, procedures, and possible side effects including pain during treatment (achy,cramping feeling), bruising, drop of blood, lightheadedness, nausea, sweating. Patient Consent Given: Yes Education handout provided: Yes Muscles treated: LT QL, Lumbar LT L3 to L5  Needle size and number: .30x72mm x 2 and .30x63mm x 2 Electrical stimulation performed: No Parameters: N/A Treatment response/outcome: Twitch response elicited and Palpable decrease in muscle tension Post-treatment instructions: Patient instructed to expect possible mild to moderate muscle soreness later today and/or tomorrow. Patient instructed in methods to reduce muscle soreness and to continue prescribed HEP.  Patient was also educated on signs and symptoms of infection and to seek medical attention should they occur. Patient verbalized understanding of these instructions and education.   Modalities: Moist hot back concurrent with  exercise                                                                                                                      PATIENT EDUCATION:  Education details: Pt education on PT impairments, prognosis, and POC. Informed consent. Rationale for interventions. Monitoring symptoms and appropriate action  Person educated: Patient Education method: Explanation, Demonstration, Tactile cues, Verbal cues Education comprehension: verbalized understanding, returned demonstration, verbal cues required, tactile cues required, and needs further education  HOME EXERCISE PROGRAM:  Access Code: [email or web address] URL: [email or web address] Date: 07/14/2022 Prepared by: Voncille Lo  Exercises - Supine Pelvic Tilt  - 1 x daily - 7 x weekly - 3 sets - 10 reps - Supine Single Knee to Chest Stretch  - 1 x daily - 7 x weekly - 1 sets - 5 reps - 10 sec hold hold - Supine Lower Trunk Rotation  - 1 x daily - 7 x weekly - 1 sets - 5 reps - 20 sec hold hold - Supine Bridge  - 1 x daily - 7 x weekly - 3 sets - 10 reps - Squat with Chair Touch  - 1 x daily - 7 x weekly - 3 sets - 10 reps ASSESSMENT:   CLINICAL IMPRESSION: 07/15/2022 Pt arrives w/ 3/10 pain, report some significant soreness after needling but states it was helpful. More fatigued today than painful. Continues to tolerate activity well, minimal progression for new exercises today given soreness/fatigue and consecutive treatment days but is able to progress for volume with familiar exercises. No adverse events, reports improved symptoms on departure (0/10 pain). Pt departs today's session in no acute distress, all voiced questions/concerns addressed appropriately from PT perspective.    EVAL- Patient is a pleasant 47 y.o. woman who was seen today for physical therapy evaluation and treatment for chronic low back pain with LLE referral. 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 Red flag screening reassuring, see above, education provided. On exam concordant pain is elicited with lumbar rotation ROM and knee/hip MMT, mild sensory deficits S1 dermatome and + slump BIL (L more so than R). 5xSTS time indicative of fall risk. Pt endorses improved symptoms after examination, no adverse events. At present would recommend skilled PT to address aforementioned deficits to maximize functional independence. Pt verbalizes agreement/understanding with plan. Pt departs today's session in no acute distress, all voiced questions/concerns addressed appropriately from PT perspective.        OBJECTIVE IMPAIRMENTS: decreased activity tolerance, decreased endurance, decreased mobility, difficulty walking, decreased ROM, decreased strength, hypomobility, and pain.    ACTIVITY LIMITATIONS: carrying, lifting, bending, sitting, standing, squatting, stairs, transfers, locomotion level, and caring for others   PARTICIPATION LIMITATIONS: meal prep, cleaning, laundry, driving, community activity, occupation, and yard work   PERSONAL FACTORS: Time since onset of injury/illness/exacerbation are also affecting patient's functional outcome.    REHAB POTENTIAL: Good   CLINICAL DECISION MAKING: Evolving/moderate complexity   EVALUATION COMPLEXITY: Moderate     GOALS: Goals reviewed with patient? No   SHORT TERM GOALS: Target date: 07/28/2022   Pt will demonstrate appropriate understanding and performance of initially prescribed HEP in order to facilitate improved independence with management of symptoms.  Baseline: HEP provided on eval Goal status: INITIAL    2. Pt will score greater than or equal to MCID on FOTO in order to demonstrate improved perception of function due to symptoms.            Baseline: Deferred on eval, will assess as able/appropriate            Goal status: INITIAL     LONG TERM GOALS: Target date: 08/25/2022     Pt will meet  predicted score on FOTO in order to demonstrate improved perception of functional status due to symptoms.  Baseline: deferred on eval, will assess as able/appropriate Goal status: INITIAL   2.  Pt will demonstrate grossly symmetrical  lumbar rotation AROM with less than 2 pt increase in pain on NPS in order to demonstrate improved tolerance to functional movement patterns.  Baseline: see ROM chart above Goal status: INITIAL   3.  Pt will demonstrate improvement in global LLE MMT of at least 1 pt in order to demonstrate improved strength for functional movements.  Baseline: see MMT chart above Goal status: INITIAL   4. Pt will perform 5xSTS in </= 14 sec in order to demonstrate reduced fall risk and improved functional independence. (MCID of 2.3sec)            Baseline: 24.96 sec with gentle UE support and increase in pain            Goal status: INITIAL    5. Pt will endorse overall pain reduction of at least 50% for improved tolerance to daily activities.             Baseline: 5-8/10 on NPS            Goal status: INITIAL   6. Pt will be independent w/ final prescribed HEP in order to promote improved self management of symptoms.             Baseline: TBD            Goal status: INITIAL    PLAN:   PT FREQUENCY: 2x/week   PT DURATION: 8 weeks   PLANNED INTERVENTIONS: Therapeutic exercises, Therapeutic activity, Neuromuscular re-education, Balance training, Gait training, Patient/Family education, Self Care, Joint mobilization, Joint manipulation, Stair training, Aquatic Therapy, Dry Needling, Electrical stimulation, Spinal manipulation, Spinal mobilization, Cryotherapy, Moist heat, Taping, Manual therapy, and Re-evaluation.   PLAN FOR NEXT SESSION: continue with core/hip strengthening and mobility   Leeroy Cha PT, DPT 07/15/2022 5:44 PM

## 2022-07-15 ENCOUNTER — Ambulatory Visit: Payer: Self-pay | Admitting: Physical Therapy

## 2022-07-15 ENCOUNTER — Encounter: Payer: Self-pay | Admitting: Physical Therapy

## 2022-07-15 DIAGNOSIS — R2689 Other abnormalities of gait and mobility: Secondary | ICD-10-CM

## 2022-07-15 DIAGNOSIS — M6281 Muscle weakness (generalized): Secondary | ICD-10-CM

## 2022-07-15 DIAGNOSIS — G8929 Other chronic pain: Secondary | ICD-10-CM

## 2022-07-20 NOTE — Therapy (Signed)
OUTPATIENT PHYSICAL THERAPY TREATMENT NOTE   Patient Name: Tracy Blevins MRN: [id number] DOB:Jul 22, 1975, 47 y.o., female Today's Date: 07/21/2022  PCP: Ladell Pier, MD  REFERRING PROVIDER: Meredith Pel, MD   END OF SESSION:   PT End of Session - 07/21/22 1100     Visit Number 4    Number of Visits 17    Date for PT Re-Evaluation 08/25/22    Authorization Type CAFA 100%    Authorization Time Period 05/08/22 to 11/06/22    PT Start Time 1100    PT Stop Time 1150    PT Time Calculation (min) 50 min    Activity Tolerance Patient tolerated treatment well;No increased pain    Behavior During Therapy WFL for tasks assessed/performed             Past Medical History:  Diagnosis Date   Headache    Hypothyroidism    S/P appy    S/P tubal ligation    Past Surgical History:  Procedure Laterality Date   APPENDECTOMY  2007   CESAREAN SECTION  2000   CESAREAN SECTION  2008   TUBAL LIGATION  2008   Patient Active Problem List   Diagnosis Date Noted   Chronic left-sided low back pain with left-sided sciatica 02/10/2022   Irregular periods 02/10/2022   Influenza vaccine needed 07/18/2020   Need for tetanus, diphtheria, and acellular pertussis (Tdap) vaccine 07/18/2020   Screening breast examination 02/14/2019   Breast pain, right 02/14/2019    REFERRING DIAG: Radiculopathy, lumbar region   THERAPY DIAG:  Chronic low back pain with left-sided sciatica, unspecified back pain laterality  Muscle weakness (generalized)  Other abnormalities of gait and mobility  Rationale for Evaluation and Treatment Rehabilitation  PERTINENT HISTORY: Unremarkable PMH   PRECAUTIONS: none  SUBJECTIVE:                                                                                                                                                                                      SUBJECTIVE STATEMENT:  07/21/2022 Pt states her R shoulder has been consistently bothering  her over past couple weeks. Back and hip are feeling good today.  Continues to decline interpreter services, communicates appropriately w/ this writer in [place] and is accompanied by her son who also assists.   Per eval - Appreciate assistance of video interpreter throughout. Pt states ongoing back pain for past 15 years, states that when she was younger she fell down the stairs and landed on her buttocks.  States she has been seeing a specialist, received imaging and an injection (two weeks ago). Pt states that she hasn't really had much relief  from injection, maybe a little.  Occasional symptoms into L LE when symptoms worst (after doing chores), no B symptoms. Denies numbness/tingling, describes as burning for LE symptoms. No saddle anesthesia or bowel/bladder issues (see red flag section). States she usually enjoys being fairly active with her children, including jumping on trampoline and taking walks but has had difficulty doing this due to her symptoms.   PAIN:  Are you having pain: no pain in hip or back  Location/description: low back, SIJ; sometimes refers into L LE to toes (laterally) Best-worst over past week: 5-8/10 Per eval -  - aggravating factors: driving, cleaning her home, sitting positioning, standing/walking >1hr, lying on L side - Easing factors: resting/lying down     OBJECTIVE: (objective measures completed at initial evaluation unless otherwise dated)  DIAGNOSTIC FINDINGS:  06/03/22 XR:  "AP lateral radiographs lumbar spine reviewed.  No spondylolisthesis.  No  compression fractures.  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  Degenerative disc disease present at L5-S1 to a moderate  degree.  The rest of the disc bases are relatively well-maintained."   PATIENT SURVEYS:  FOTO deferred on eval given time constraints, plan to administer as able/appropriate  07-14-22  FOTO 48% predicted 64%  SCREENING FOR RED FLAGS: Unremarkable red flag  screening - describes some chronic constipation issues which she states she is being treated for. States about 8 months ago she began to develop more frequency of urination and increased urge, denies any loss of control or sensory issues. Educated on monitoring and appropriate actions   COGNITION: Overall cognitive status: Within functional limits for tasks assessed                          SENSATION/NEURO: Light touch intact B LE aside from mildly diminished L S1 (lateral ankle) No clonus BIL LE  Negative hoffmann and tromner sign  No ataxia with gait     POSTURE: grossly WNL     LUMBAR ROM:    AROM eval  Flexion    Extension    Right lateral flexion    Left lateral flexion    Right rotation seated 75% L QL pain  Left rotation seated 50% *   (Blank rows = not tested) (Key: WFL = within functional limits not formally assessed, * = concordant pain, s = stiffness/stretching sensation, NT = not tested)    LOWER EXTREMITY ROM:      Active  Right eval Left eval  Hip flexion      Hip extension      Hip internal rotation      Hip external rotation      Knee extension      Knee flexion      (Blank rows = not tested) (Key: WFL = within functional limits not formally assessed, * = concordant pain, s = stiffness/stretching sensation, NT = not tested)  Comments:     LOWER EXTREMITY MMT:     MMT Right eval Left eval  Hip flexion 4 3+ *  Hip abduction (modified sitting) 4 4  Hip internal rotation      Hip external rotation      Knee flexion 4 4-   Knee extension 4 4- *  Ankle dorsiflexion 4 4    (Blank rows = not tested) (Key: WFL = within functional limits not formally assessed, * = concordant pain, s = stiffness/stretching sensation, NT = not tested)  Comments:  LUMBAR SPECIAL TESTS:  Slump test: + BIL, L more positive than R (pulling in low back on R, back pain + LE pain on L)   FUNCTIONAL TESTS:  5xSTS: 24.96 sec gentle UE support, increased low back pain    GAIT: Distance walked: within clinic Assistive device utilized: None Level of assistance: Complete Independence Comments: gait mechanics grossly WNL, mildly reduced gait speed and cadence    Vitals 07/21/22: HR 68-71, SpO2 96-97% RA, BP 127/91; no symptoms in session   TODAY'S TREATMENT:      [facility] Treatment:                                                DATE: 07/21/22 Therapeutic Exercise: SKTC x3 BIL 10sec hold cues for form  Supine ER stretch 5x10sec hold Bridge + adductor iso 2x8 cues for setup and form  Seated ER/IR GTB w/ ball as fulcrum 2x8 each cues for form and posture   Swiss ball press down standing 2x10 cues for breath control and form Seated hip abd GTB cues for form x10 HEP handout + education  - also spent time discussing new report of shoulder/chest pain, see assessment below. Education on symptom journal, ED if symptoms worsen, calling MD after leaving today's session  [facility] Adult PT Treatment:                                                DATE: 07/15/22 Therapeutic Exercise: SKTC 5x10 sec R and L cues for breath control  Pelvic tilts 2x10 cues for reduced compensations  Seated adductor iso 2x12 cues for comfortable ROM and posture 3 way swiss ball rollout x8 each way cues for comfortable ROM  Suitcase carry 10# 3x63ft BIL cues for posture and pacing Seated marches 2x8 BIL cues for form and reduced compensations at trunk  Discussed HEP performance                                                                                                                      PATIENT EDUCATION:  Education details: rationale for interventions, HEP  Person educated: Patient Education method: Explanation, Demonstration, Tactile cues, Verbal cues Education comprehension: verbalized understanding, returned demonstration, verbal cues required, tactile cues required, and needs further education     HOME EXERCISE PROGRAM:  Access Code: [email or web address] URL:  [email or web address] Date: 07/21/2022 Prepared by: Enis Slipper  Exercises - Supine Single Knee to Chest Stretch  - 1 x daily - 7 x weekly - 1 sets - 5 reps - 10 sec hold hold - Supine Lower Trunk Rotation  - 1 x daily - 7 x weekly - 1 sets - 5 reps - 20 sec hold hold - Supine Bridge  - 1  x daily - 7 x weekly - 3 sets - 10 reps - Squat with Chair Touch  - 1 x daily - 7 x weekly - 3 sets - 10 reps - Seated Hip Abduction with Resistance  - 1 x daily - 7 x weekly - 2 sets - 10 reps  ASSESSMENT:   CLINICAL IMPRESSION: 07/21/2022 Pt arrives without hip or back pain, denies issues after last session although she does endorse gradual onset of R shoulder pain overt last couple of weeks, no MOI and demos limitations in all planes (encouraged to discuss with MD after today's visit). Pt tolerates today's session well overall, no increase in resting pain, no adverse events, and care taken to avoid irritating R shoulder - primary report of muscle fatigue.  At end of session, pt mentions that over past few days she has also had some new intermittent L sided chest pain that typically occurs for a couple of seconds, not provoked by any specific activity and not present during today's session. States it has occurred 2-3 times. Vitals WNL as above and no symptoms today. Educated on calling MD after session to notify them, also encouraged journaling symptoms, and education on presenting to ED if symptoms worsen. Pt and son verbalize understanding/agreement, state they will notify MD after leaving today.  Recommend continuing along current POC in order to address relevant deficits and improve functional tolerance. Pt departs today's session in no acute distress, all voiced questions/concerns addressed appropriately from PT perspective.     EVAL- Patient is a pleasant 47 y.o. woman who was seen today for physical therapy evaluation and treatment for chronic low back pain with LLE referral. 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 Red flag screening reassuring, see above, education provided. On exam concordant pain is elicited with lumbar rotation ROM and knee/hip MMT, mild sensory deficits S1 dermatome and + slump BIL (L more so than R). 5xSTS time indicative of fall risk. Pt endorses improved symptoms after examination, no adverse events. At present would recommend skilled PT to address aforementioned deficits to maximize functional independence. Pt verbalizes agreement/understanding with plan. Pt departs today's session in no acute distress, all voiced questions/concerns addressed appropriately from PT perspective.        OBJECTIVE IMPAIRMENTS: decreased activity tolerance, decreased endurance, decreased mobility, difficulty walking, decreased ROM, decreased strength, hypomobility, and pain.    ACTIVITY LIMITATIONS: carrying, lifting, bending, sitting, standing, squatting, stairs, transfers, locomotion level, and caring for others   PARTICIPATION LIMITATIONS: meal prep, cleaning, laundry, driving, community activity, occupation, and yard work   PERSONAL FACTORS: Time since onset of injury/illness/exacerbation are also affecting patient's functional outcome.    REHAB POTENTIAL: Good   CLINICAL DECISION MAKING: Evolving/moderate complexity   EVALUATION COMPLEXITY: Moderate     GOALS: Goals reviewed with patient? No   SHORT TERM GOALS: Target date: 07/28/2022   Pt will demonstrate appropriate understanding and performance of initially prescribed HEP in order to facilitate improved independence with management of symptoms.  Baseline: HEP provided on eval Goal status: INITIAL    2. Pt will score greater than or equal to MCID on FOTO in order to demonstrate improved perception of function due to symptoms.            Baseline: Deferred on eval, will assess as able/appropriate             Goal status: INITIAL  LONG TERM GOALS: Target date: 08/25/2022     Pt will meet predicted score on FOTO in order to demonstrate improved perception of functional status due to symptoms.  Baseline: deferred on eval, will assess as able/appropriate Goal status: INITIAL   2.  Pt will demonstrate grossly symmetrical lumbar rotation AROM with less than 2 pt increase in pain on NPS in order to demonstrate improved tolerance to functional movement patterns.  Baseline: see ROM chart above Goal status: INITIAL   3.  Pt will demonstrate improvement in global LLE MMT of at least 1 pt in order to demonstrate improved strength for functional movements.  Baseline: see MMT chart above Goal status: INITIAL   4. Pt will perform 5xSTS in </= 14 sec in order to demonstrate reduced fall risk and improved functional independence. (MCID of 2.3sec)            Baseline: 24.96 sec with gentle UE support and increase in pain            Goal status: INITIAL    5. Pt will endorse overall pain reduction of at least 50% for improved tolerance to daily activities.             Baseline: 5-8/10 on NPS            Goal status: INITIAL   6. Pt will be independent w/ final prescribed HEP in order to promote improved self management of symptoms.             Baseline: TBD            Goal status: INITIAL    PLAN:   PT FREQUENCY: 2x/week   PT DURATION: 8 weeks   PLANNED INTERVENTIONS: Therapeutic exercises, Therapeutic activity, Neuromuscular re-education, Balance training, Gait training, Patient/Family education, Self Care, Joint mobilization, Joint manipulation, Stair training, Aquatic Therapy, Dry Needling, Electrical stimulation, Spinal manipulation, Spinal mobilization, Cryotherapy, Moist heat, Taping, Manual therapy, and Re-evaluation.   PLAN FOR NEXT SESSION: continue with core/hip strengthening and mobility. Check in about shoulder/chest pain next session     Leeroy Cha PT, DPT 07/21/2022 1:32 PM

## 2022-07-21 ENCOUNTER — Encounter: Payer: Self-pay | Admitting: Physical Therapy

## 2022-07-21 ENCOUNTER — Ambulatory Visit: Payer: Self-pay | Attending: Orthopedic Surgery | Admitting: Physical Therapy

## 2022-07-21 DIAGNOSIS — R2689 Other abnormalities of gait and mobility: Secondary | ICD-10-CM

## 2022-07-21 DIAGNOSIS — M5442 Lumbago with sciatica, left side: Secondary | ICD-10-CM | POA: Insufficient documentation

## 2022-07-21 DIAGNOSIS — M6281 Muscle weakness (generalized): Secondary | ICD-10-CM

## 2022-07-21 DIAGNOSIS — G8929 Other chronic pain: Secondary | ICD-10-CM

## 2022-07-22 NOTE — Therapy (Signed)
OUTPATIENT PHYSICAL THERAPY TREATMENT NOTE   Patient Name: Tracy Blevins MRN: [id number] DOB:[birth year], 47 y.o., female 68 Date: 07/23/2022  PCP: Ladell Pier, MD  REFERRING PROVIDER: Meredith Pel, MD   END OF SESSION:   PT End of Session - 07/23/22 1059     Visit Number 5    Number of Visits 17    Date for PT Re-Evaluation 08/25/22    Authorization Type [employer] 100%    Authorization Time Period 05/08/22 to 11/06/22    PT Start Time 1100    PT Stop Time 1144    PT Time Calculation (min) 44 min    Activity Tolerance Patient tolerated treatment well;No increased pain    Behavior During Therapy WFL for tasks assessed/performed              Past Medical History:  Diagnosis Date   Headache    Hypothyroidism    S/P appy    S/P tubal ligation    Past Surgical History:  Procedure Laterality Date   APPENDECTOMY  2007   CESAREAN SECTION  2000   CESAREAN SECTION  2008   TUBAL LIGATION  2008   Patient Active Problem List   Diagnosis Date Noted   Chronic left-sided low back pain with left-sided sciatica 02/10/2022   Irregular periods 02/10/2022   Influenza vaccine needed 07/18/2020   Need for tetanus, diphtheria, and acellular pertussis (Tdap) vaccine 07/18/2020   Screening breast examination 02/14/2019   Breast pain, right 02/14/2019    REFERRING DIAG: Radiculopathy, lumbar region   THERAPY DIAG:  Chronic low back pain with left-sided sciatica, unspecified back pain laterality  Muscle weakness (generalized)  Other abnormalities of gait and mobility  Rationale for Evaluation and Treatment Rehabilitation  PERTINENT HISTORY: Unremarkable PMH   PRECAUTIONS: none  SUBJECTIVE:                                                                                                                                                                                      SUBJECTIVE STATEMENT:  07/23/2022 Continues to politely decline interpeter services,  daughter present and assists with communication throughout. Pt reports fatigue after last session but no increase in pain until yesterday, which she attributes to riding in car a lot.  She states she was able to speak with MD after last session and she was advised to keep her appt with them next week and if symptoms occur to present to ED. She notes that she has had no more instances of chest pain.    Per eval - Appreciate assistance of video interpreter throughout. Pt states ongoing back pain for past 15 years, states that  when she was younger she fell down the stairs and landed on her buttocks.  States she has been seeing a specialist, received imaging and an injection (two weeks ago). Pt states that she hasn't really had much relief from injection, maybe a little.  Occasional symptoms into L LE when symptoms worst (after doing chores), no B symptoms. Denies numbness/tingling, describes as burning for LE symptoms. No saddle anesthesia or bowel/bladder issues (see red flag section). States she usually enjoys being fairly active with her children, including jumping on trampoline and taking walks but has had difficulty doing this due to her symptoms.   PAIN:  Are you having pain: 5/10 low back pain Location/description: low back, SIJ; sometimes refers into L LE to toes (laterally) Best-worst over past week: 5-8/10 Per eval -  - aggravating factors: driving, cleaning her home, sitting positioning, standing/walking >1hr, lying on L side - Easing factors: resting/lying down     OBJECTIVE: (objective measures completed at initial evaluation unless otherwise dated)  DIAGNOSTIC FINDINGS:  06/03/22 XR:  "AP lateral radiographs lumbar spine reviewed.  No spondylolisthesis.  No  compression fractures.  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  Degenerative disc disease present at L5-S1 to a moderate  degree.  The rest of the disc bases are relatively well-maintained."   PATIENT  SURVEYS:  FOTO deferred on eval given time constraints, plan to administer as able/appropriate  07-14-22  FOTO 48% predicted 64%  SCREENING FOR RED FLAGS: Unremarkable red flag screening - describes some chronic constipation issues which she states she is being treated for. States about 8 months ago she began to develop more frequency of urination and increased urge, denies any loss of control or sensory issues. Educated on monitoring and appropriate actions   COGNITION: Overall cognitive status: Within functional limits for tasks assessed                          SENSATION/NEURO: Light touch intact B LE aside from mildly diminished L S1 (lateral ankle) No clonus BIL LE  Negative hoffmann and tromner sign  No ataxia with gait     POSTURE: grossly WNL     LUMBAR ROM:    AROM eval  Flexion    Extension    Right lateral flexion    Left lateral flexion    Right rotation seated 75% L QL pain  Left rotation seated 50% *   (Blank rows = not tested) (Key: WFL = within functional limits not formally assessed, * = concordant pain, s = stiffness/stretching sensation, NT = not tested)    LOWER EXTREMITY ROM:      Active  Right eval Left eval  Hip flexion      Hip extension      Hip internal rotation      Hip external rotation      Knee extension      Knee flexion      (Blank rows = not tested) (Key: WFL = within functional limits not formally assessed, * = concordant pain, s = stiffness/stretching sensation, NT = not tested)  Comments:     LOWER EXTREMITY MMT:     MMT Right eval Left eval  Hip flexion 4 3+ *  Hip abduction (modified sitting) 4 4  Hip internal rotation      Hip external rotation      Knee flexion 4 4-   Knee extension 4 4- *  Ankle dorsiflexion  4 4    (Blank rows = not tested) (Key: WFL = within functional limits not formally assessed, * = concordant pain, s = stiffness/stretching sensation, NT = not tested)  Comments:     LUMBAR SPECIAL TESTS:   Slump test: + BIL, L more positive than R (pulling in low back on R, back pain + LE pain on L)   FUNCTIONAL TESTS:  5xSTS: 24.96 sec gentle UE support, increased low back pain   GAIT: Distance walked: within clinic Assistive device utilized: None Level of assistance: Complete Independence Comments: gait mechanics grossly WNL, mildly reduced gait speed and cadence    Vitals 07/21/22: HR 68-71, SpO2 96-97% RA, BP 127/91; no symptoms in session  Vitals 07/23/22:  HR 60s SpO2 98-99% RA   TODAY'S TREATMENT:      [facility] Adult PT Treatment:                                                DATE: 07/23/22 Therapeutic Exercise: LTR 2x5 BIL DKTC x8 Bridge + GTB abduction 3x6 cues for form  Seated swiss ball press down 2x10 cues for breath control and reduced compensations Sidebending iso, sidebending w/ swiss ball each way 2x8 cues for form and reduced compensations    [facility] Adult PT Treatment:                                                DATE: 07/21/22 Therapeutic Exercise: SKTC x3 BIL 10sec hold cues for form  Supine ER stretch 5x10sec hold Bridge + adductor iso 2x8 cues for setup and form  Seated ER/IR GTB w/ ball as fulcrum 2x8 each cues for form and posture   Swiss ball press down standing 2x10 cues for breath control and form Seated hip abd GTB cues for form x10 HEP handout + education  - also spent time discussing new report of shoulder/chest pain, see assessment below. Education on symptom journal, ED if symptoms worsen, calling MD after leaving today's session  [facility] Adult PT Treatment:                                                DATE: 07/15/22 Therapeutic Exercise: SKTC 5x10 sec R and L cues for breath control  Pelvic tilts 2x10 cues for reduced compensations  Seated adductor iso 2x12 cues for comfortable ROM and posture 3 way swiss ball rollout x8 each way cues for comfortable ROM  Suitcase carry 10# 3x44ft BIL cues for posture and pacing Seated marches 2x8 BIL cues for form  and reduced compensations at trunk  Discussed HEP performance  PATIENT EDUCATION:  Education details: rationale for interventions, HEP  Person educated: Patient Education method: Explanation, Demonstration, Tactile cues, Verbal cues Education comprehension: verbalized understanding, returned demonstration, verbal cues required, tactile cues required, and needs further education     HOME EXERCISE PROGRAM:  Access Code: [email or web address] URL: [email or web address] Date: 07/21/2022 Prepared by: Enis Slipper  Exercises - Supine Single Knee to Chest Stretch  - 1 x daily - 7 x weekly - 1 sets - 5 reps - 10 sec hold hold - Supine Lower Trunk Rotation  - 1 x daily - 7 x weekly - 1 sets - 5 reps - 20 sec hold hold - Supine Bridge  - 1 x daily - 7 x weekly - 3 sets - 10 reps - Squat with Chair Touch  - 1 x daily - 7 x weekly - 3 sets - 10 reps - Seated Hip Abduction with Resistance  - 1 x daily - 7 x weekly - 2 sets - 10 reps  ASSESSMENT:   CLINICAL IMPRESSION: 07/23/2022 Pt arrives w/ 5/10 pain on NPS in low back, she attributes to being in car more yesterday. Notes no more instances of chest pain, she was able to speak with her doctor and she states they encouraged her to go to hospital if occurs again, otherwise will follow up next week. Today pt able to progress for increased resistance/repetition for lumbopelvic stability/strengthening despite increased symptoms on arrival, improves as session goes on. Reports shoulder pain continues although improving, and improves w/ activities during today's session. Departs with 1/10 pain in L low back, no instances of chest pain and HR/SpO2 WNL. Reinforced seeking medical attention if occurs further. No adverse events. Pt departs today's session in no acute distress, all voiced questions/concerns addressed appropriately from PT perspective.       EVAL- Patient is a pleasant 47 y.o. woman who was seen today for physical therapy evaluation and treatment for chronic low back pain with LLE referral. 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 Red flag screening reassuring, see above, education provided. On exam concordant pain is elicited with lumbar rotation ROM and knee/hip MMT, mild sensory deficits S1 dermatome and + slump BIL (L more so than R). 5xSTS time indicative of fall risk. Pt endorses improved symptoms after examination, no adverse events. At present would recommend skilled PT to address aforementioned deficits to maximize functional independence. Pt verbalizes agreement/understanding with plan. Pt departs today's session in no acute distress, all voiced questions/concerns addressed appropriately from PT perspective.        OBJECTIVE IMPAIRMENTS: decreased activity tolerance, decreased endurance, decreased mobility, difficulty walking, decreased ROM, decreased strength, hypomobility, and pain.    ACTIVITY LIMITATIONS: carrying, lifting, bending, sitting, standing, squatting, stairs, transfers, locomotion level, and caring for others   PARTICIPATION LIMITATIONS: meal prep, cleaning, laundry, driving, community activity, occupation, and yard work   PERSONAL FACTORS: Time since onset of injury/illness/exacerbation are also affecting patient's functional outcome.    REHAB POTENTIAL: Good   CLINICAL DECISION MAKING: Evolving/moderate complexity   EVALUATION COMPLEXITY: Moderate     GOALS: Goals reviewed with patient? No   SHORT TERM GOALS: Target date: 07/28/2022   Pt will demonstrate appropriate understanding and performance of initially prescribed HEP in order to facilitate improved independence with management of symptoms.  Baseline: HEP provided on eval Goal status: INITIAL    2. Pt will score greater  than or equal to MCID  on FOTO in order to demonstrate improved perception of function due to symptoms.            Baseline: Deferred on eval, will assess as able/appropriate            Goal status: INITIAL     LONG TERM GOALS: Target date: 08/25/2022     Pt will meet predicted score on FOTO in order to demonstrate improved perception of functional status due to symptoms.  Baseline: deferred on eval, will assess as able/appropriate Goal status: INITIAL   2.  Pt will demonstrate grossly symmetrical lumbar rotation AROM with less than 2 pt increase in pain on NPS in order to demonstrate improved tolerance to functional movement patterns.  Baseline: see ROM chart above Goal status: INITIAL   3.  Pt will demonstrate improvement in global LLE MMT of at least 1 pt in order to demonstrate improved strength for functional movements.  Baseline: see MMT chart above Goal status: INITIAL   4. Pt will perform 5xSTS in </= 14 sec in order to demonstrate reduced fall risk and improved functional independence. (MCID of 2.3sec)            Baseline: 24.96 sec with gentle UE support and increase in pain            Goal status: INITIAL    5. Pt will endorse overall pain reduction of at least 50% for improved tolerance to daily activities.             Baseline: 5-8/10 on NPS            Goal status: INITIAL   6. Pt will be independent w/ final prescribed HEP in order to promote improved self management of symptoms.             Baseline: TBD            Goal status: INITIAL    PLAN:   PT FREQUENCY: 2x/week   PT DURATION: 8 weeks   PLANNED INTERVENTIONS: Therapeutic exercises, Therapeutic activity, Neuromuscular re-education, Balance training, Gait training, Patient/Family education, Self Care, Joint mobilization, Joint manipulation, Stair training, Aquatic Therapy, Dry Needling, Electrical stimulation, Spinal manipulation, Spinal mobilization, Cryotherapy, Moist heat, Taping, Manual therapy, and  Re-evaluation.   PLAN FOR NEXT SESSION: continue with core/hip strengthening and mobility. Continue to monitor chest/shoulder pain   Leeroy Cha PT, DPT 07/23/2022 12:42 PM

## 2022-07-23 ENCOUNTER — Ambulatory Visit: Payer: Self-pay | Admitting: Physical Therapy

## 2022-07-23 ENCOUNTER — Encounter: Payer: Self-pay | Admitting: Physical Therapy

## 2022-07-23 DIAGNOSIS — M6281 Muscle weakness (generalized): Secondary | ICD-10-CM

## 2022-07-23 DIAGNOSIS — G8929 Other chronic pain: Secondary | ICD-10-CM

## 2022-07-23 DIAGNOSIS — R2689 Other abnormalities of gait and mobility: Secondary | ICD-10-CM

## 2022-07-27 ENCOUNTER — Ambulatory Visit: Payer: Self-pay | Admitting: Physical Therapy

## 2022-07-27 DIAGNOSIS — G8929 Other chronic pain: Secondary | ICD-10-CM

## 2022-07-27 DIAGNOSIS — R2689 Other abnormalities of gait and mobility: Secondary | ICD-10-CM

## 2022-07-27 DIAGNOSIS — M6281 Muscle weakness (generalized): Secondary | ICD-10-CM

## 2022-07-27 NOTE — Therapy (Signed)
OUTPATIENT PHYSICAL THERAPY TREATMENT NOTE   Patient Name: Tracy Blevins MRN: [id number] DOB:Feb 26, 1976, 47 y.o., female Today's Date: 07/28/2022  PCP: Marcine Matar, MD  REFERRING PROVIDER: Cammy Copa, MD   END OF SESSION:   PT End of Session - 07/28/22 1545     Visit Number 7    Number of Visits 17    Date for PT Re-Evaluation 08/25/22    Authorization Type [employer] 100%    Authorization Time Period 05/08/22 to 11/06/22    PT Start Time 1546    PT Stop Time 1625    PT Time Calculation (min) 39 min    Activity Tolerance Patient tolerated treatment well;No increased pain    Behavior During Therapy WFL for tasks assessed/performed              Past Medical History:  Diagnosis Date   Headache    Hypothyroidism    S/P appy    S/P tubal ligation    Past Surgical History:  Procedure Laterality Date   APPENDECTOMY  2007   CESAREAN SECTION  2000   CESAREAN SECTION  2008   TUBAL LIGATION  2008   Patient Active Problem List   Diagnosis Date Noted   Chronic left-sided low back pain with left-sided sciatica 02/10/2022   Irregular periods 02/10/2022   Influenza vaccine needed 07/18/2020   Need for tetanus, diphtheria, and acellular pertussis (Tdap) vaccine 07/18/2020   Screening breast examination 02/14/2019   Breast pain, right 02/14/2019    REFERRING DIAG: Radiculopathy, lumbar region   THERAPY DIAG:  Chronic low back pain with left-sided sciatica, unspecified back pain laterality  Muscle weakness (generalized)  Other abnormalities of gait and mobility  Rationale for Evaluation and Treatment Rehabilitation  PERTINENT HISTORY: Unremarkable PMH   PRECAUTIONS: none  SUBJECTIVE:                                                                                                                                                                                      SUBJECTIVE STATEMENT:  07/28/2022 Pt states she is tired today from painting her  baseboards this morning but doesn't have any pain. Felt good after last session, continues to politely decline interpreter services  Per eval - Appreciate assistance of video interpreter throughout. Pt states ongoing back pain for past 15 years, states that when she was younger she fell down the stairs and landed on her buttocks.  States she has been seeing a specialist, received imaging and an injection (two weeks ago). Pt states that she hasn't really had much relief from injection, maybe a little.  Occasional symptoms into L LE when symptoms worst (after  doing chores), no B symptoms. Denies numbness/tingling, describes as burning for LE symptoms. No saddle anesthesia or bowel/bladder issues (see red flag section). States she usually enjoys being fairly active with her children, including jumping on trampoline and taking walks but has had difficulty doing this due to her symptoms.   PAIN:  Are you having pain: no pain today Location/description: low back, SIJ; sometimes refers into L LE to toes (laterally) Best/worst over past week: 0-5/10  Per eval -  Best-worst over past week: 5-8/10 - aggravating factors: driving, cleaning her home, sitting positioning, standing/walking >1hr, lying on L side - Easing factors: resting/lying down     OBJECTIVE: (objective measures completed at initial evaluation unless otherwise dated)  DIAGNOSTIC FINDINGS:  06/03/22 XR:  "AP lateral radiographs lumbar spine reviewed.  No spondylolisthesis.  No  compression fractures.  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  Degenerative disc disease present at L5-S1 to a moderate  degree.  The rest of the disc bases are relatively well-maintained."   PATIENT SURVEYS:  FOTO deferred on eval given time constraints, plan to administer as able/appropriate  07-14-22  FOTO 48% predicted 64% 07/28/22: FOTO 45%   SCREENING FOR RED FLAGS: Unremarkable red flag screening - describes some chronic  constipation issues which she states she is being treated for. States about 8 months ago she began to develop more frequency of urination and increased urge, denies any loss of control or sensory issues. Educated on monitoring and appropriate actions   COGNITION: Overall cognitive status: Within functional limits for tasks assessed                          SENSATION/NEURO: Light touch intact B LE aside from mildly diminished L S1 (lateral ankle) No clonus BIL LE  Negative hoffmann and tromner sign  No ataxia with gait     POSTURE: grossly WNL     LUMBAR ROM:    AROM eval  Flexion    Extension    Right lateral flexion    Left lateral flexion    Right rotation seated 75% L QL pain  Left rotation seated 50% *   (Blank rows = not tested) (Key: WFL = within functional limits not formally assessed, * = concordant pain, s = stiffness/stretching sensation, NT = not tested)    LOWER EXTREMITY ROM:      Active  Right eval Left eval  Hip flexion      Hip extension      Hip internal rotation      Hip external rotation      Knee extension      Knee flexion      (Blank rows = not tested) (Key: WFL = within functional limits not formally assessed, * = concordant pain, s = stiffness/stretching sensation, NT = not tested)  Comments:     LOWER EXTREMITY MMT:     MMT Right eval Left eval  Hip flexion 4 3+ *  Hip abduction (modified sitting) 4 4  Hip internal rotation      Hip external rotation      Knee flexion 4 4-   Knee extension 4 4- *  Ankle dorsiflexion 4 4    (Blank rows = not tested) (Key: WFL = within functional limits not formally assessed, * = concordant pain, s = stiffness/stretching sensation, NT = not tested)  Comments:     LUMBAR SPECIAL TESTS:  Slump test: + BIL,  L more positive than R (pulling in low back on R, back pain + LE pain on L)   FUNCTIONAL TESTS:  5xSTS: 24.96 sec gentle UE support, increased low back pain   GAIT: Distance walked: within  clinic Assistive device utilized: None Level of assistance: Complete Independence Comments: gait mechanics grossly WNL, mildly reduced gait speed and cadence    Vitals 07/21/22: HR 68-71, SpO2 96-97% RA, BP 127/91; no symptoms in session  Vitals 07/23/22:  HR 60s SpO2 98-99% RA  Vitals 07/27/22: HR 68-74bpm, SpO2 99% RA; monitored throughout and stable   TODAY'S TREATMENT:      [facility] Adult PT Treatment:                                                DATE: 07/28/22 Therapeutic Exercise: LTR x10 BIL SKTC 5 sec hold x8 BIL Supine ER stretch x8 BIL  Standing QL stretch ball at wall 2x8 BIL cues for setup and form  Swiss ball press down 2x12 cues for breath control HEP discussion, exercise/activity at home   [facility]:                                                DATE: 07/27/22 Therapeutic Exercise: LTR x8 BIL cues for setup DKTC x8 cues for setup Hamstring rollout physioball, supine, 2x12 cues for form/pacing STS 10# x10, STS 15# x8 cues for form and BOS Suitcase carry 10# [id number] BIL cues for form and pacing  Seated chest press 2kg ball, 2x8 cues for form and pacing, posture  Seated swiss ball rollout 3 way x10 each cues for appropriate plane of movement HEP update, handout + education   [facility]:                                                DATE: 07/23/22 Therapeutic Exercise: LTR 2x5 BIL DKTC x8 Bridge + GTB abduction 3x6 cues for form  Seated swiss ball press down 2x10 cues for breath control and reduced compensations Sidebending iso, sidebending w/ swiss ball each way 2x8 cues for form and reduced compensations                                                                                                                        PATIENT EDUCATION:  Education details: rationale for interventions, HEP  Person educated: Patient Education method: Explanation, Demonstration, Tactile cues, Verbal cues Education comprehension: verbalized understanding,  returned demonstration, verbal cues required, tactile cues required, and needs further education     HOME EXERCISE PROGRAM: Access Code:  [email or web address] URL: [email or web address] Date: 07/27/2022 Prepared by: Fransisco Hertz  Exercises - Supine Lower Trunk Rotation  - 1 x daily - 7 x weekly - 1 sets - 5 reps - 20 sec hold hold - Supine Bridge with Resistance Band  - 1 x daily - 7 x weekly - 3 sets - 5 reps - Supine Double Knee to Chest Modified  - 1 x daily - 7 x weekly - 2 sets - 8 reps - 5sec hold - Sit to Stand  - 1 x daily - 7 x weekly - 2 sets - 12 reps  ASSESSMENT:   CLINICAL IMPRESSION: 07/28/2022 Pt arrives w/o pain, endorses fatigue from painting her baseboards this morning. Continues to deny any further instances of chest pain, says she will be speaking to her PCP about it tomorrow. Given consecutive treatment days and report of fatigue from heavier activities earlier today, focusing primarily on mobility today to reduce muscle fatigue/tension. Pt tolerates well, denies any increases in pain as session goes on. FOTO administered today and scored at 45 which is worsened compared to 48 on initial scoring, although this contradicts pt report of notably improved tolerance since starting PT. No adverse events, no pain on departure. Pt departs today's session in no acute distress, all voiced questions/concerns addressed appropriately from PT perspective.     EVAL- Patient is a pleasant 47 y.o. woman who was seen today for physical therapy evaluation and treatment for chronic low back pain with LLE referral. 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 Red flag screening reassuring, see above, education provided. On exam concordant pain is elicited with lumbar rotation ROM and knee/hip MMT, mild sensory deficits S1 dermatome and + slump BIL (L more so than R). 5xSTS time  indicative of fall risk. Pt endorses improved symptoms after examination, no adverse events. At present would recommend skilled PT to address aforementioned deficits to maximize functional independence. Pt verbalizes agreement/understanding with plan. Pt departs today's session in no acute distress, all voiced questions/concerns addressed appropriately from PT perspective.        OBJECTIVE IMPAIRMENTS: decreased activity tolerance, decreased endurance, decreased mobility, difficulty walking, decreased ROM, decreased strength, hypomobility, and pain.    ACTIVITY LIMITATIONS: carrying, lifting, bending, sitting, standing, squatting, stairs, transfers, locomotion level, and caring for others   PARTICIPATION LIMITATIONS: meal prep, cleaning, laundry, driving, community activity, occupation, and yard work   PERSONAL FACTORS: Time since onset of injury/illness/exacerbation are also affecting patient's functional outcome.    REHAB POTENTIAL: Good   CLINICAL DECISION MAKING: Evolving/moderate complexity   EVALUATION COMPLEXITY: Moderate     GOALS: Goals reviewed with patient? No   SHORT TERM GOALS: Target date: 07/28/2022   Pt will demonstrate appropriate understanding and performance of initially prescribed HEP in order to facilitate improved independence with management of symptoms.  Baseline: HEP provided on eval 07/28/22: pt reports excellent adherence/tolerance to HEP Goal status: MET   2. Pt will score greater than or equal to MCID on FOTO in order to demonstrate improved perception of function due to symptoms.            Baseline: Deferred on eval, will assess as able/appropriate  07/28/22: reduction in FOTO score by 3 pts            Goal status: ONGOING   LONG TERM GOALS: Target date: 08/25/2022     Pt will meet predicted score on FOTO in  order to demonstrate improved perception of functional status due to symptoms.  Baseline: deferred on eval, will assess as able/appropriate Goal  status: INITIAL   2.  Pt will demonstrate grossly symmetrical lumbar rotation AROM with less than 2 pt increase in pain on NPS in order to demonstrate improved tolerance to functional movement patterns.  Baseline: see ROM chart above Goal status: INITIAL   3.  Pt will demonstrate improvement in global LLE MMT of at least 1 pt in order to demonstrate improved strength for functional movements.  Baseline: see MMT chart above Goal status: INITIAL   4. Pt will perform 5xSTS in </= 14 sec in order to demonstrate reduced fall risk and improved functional independence. (MCID of 2.3sec)            Baseline: 24.96 sec with gentle UE support and increase in pain            Goal status: INITIAL    5. Pt will endorse overall pain reduction of at least 50% for improved tolerance to daily activities.             Baseline: 5-8/10 on NPS            Goal status: INITIAL   6. Pt will be independent w/ final prescribed HEP in order to promote improved self management of symptoms.             Baseline: TBD            Goal status: INITIAL    PLAN:   PT FREQUENCY: 2x/week   PT DURATION: 8 weeks   PLANNED INTERVENTIONS: Therapeutic exercises, Therapeutic activity, Neuromuscular re-education, Balance training, Gait training, Patient/Family education, Self Care, Joint mobilization, Joint manipulation, Stair training, Aquatic Therapy, Dry Needling, Electrical stimulation, Spinal manipulation, Spinal mobilization, Cryotherapy, Moist heat, Taping, Manual therapy, and Re-evaluation.   PLAN FOR NEXT SESSION: incorporate more hip flexor work, functional strengthening/endurance   Ashley Murrain PT, DPT 07/28/2022 4:33 PM

## 2022-07-27 NOTE — Therapy (Signed)
OUTPATIENT PHYSICAL THERAPY TREATMENT NOTE   Patient Name: Tracy Blevins MRN: [id number] DOB:1975-06-30, 47 y.o., female Today's Date: 07/27/2022  PCP: Marcine Matar, MD  REFERRING PROVIDER: Cammy Copa, MD   END OF SESSION:   PT End of Session - 07/27/22 1100     Visit Number 6    Number of Visits 17    Date for PT Re-Evaluation 08/25/22    Authorization Type [employer] 100%    Authorization Time Period 05/08/22 to 11/06/22    PT Start Time 1101    PT Stop Time 1145    PT Time Calculation (min) 44 min    Activity Tolerance Patient tolerated treatment well;No increased pain    Behavior During Therapy WFL for tasks assessed/performed               Past Medical History:  Diagnosis Date   Headache    Hypothyroidism    S/P appy    S/P tubal ligation    Past Surgical History:  Procedure Laterality Date   APPENDECTOMY  2007   CESAREAN SECTION  2000   CESAREAN SECTION  2008   TUBAL LIGATION  2008   Patient Active Problem List   Diagnosis Date Noted   Chronic left-sided low back pain with left-sided sciatica 02/10/2022   Irregular periods 02/10/2022   Influenza vaccine needed 07/18/2020   Need for tetanus, diphtheria, and acellular pertussis (Tdap) vaccine 07/18/2020   Screening breast examination 02/14/2019   Breast pain, right 02/14/2019    REFERRING DIAG: Radiculopathy, lumbar region   THERAPY DIAG:  Chronic low back pain with left-sided sciatica, unspecified back pain laterality  Muscle weakness (generalized)  Other abnormalities of gait and mobility  Rationale for Evaluation and Treatment Rehabilitation  PERTINENT HISTORY: Unremarkable PMH   PRECAUTIONS: none  SUBJECTIVE:                                                                                                                                                                                      SUBJECTIVE STATEMENT:  07/27/2022 Pt states she feels good today, felt much better  after last session. No more instances of chest pain, shoulder also doing better. Sees MD this week  Per eval - Appreciate assistance of video interpreter throughout. Pt states ongoing back pain for past 15 years, states that when she was younger she fell down the stairs and landed on her buttocks.  States she has been seeing a specialist, received imaging and an injection (two weeks ago). Pt states that she hasn't really had much relief from injection, maybe a little.  Occasional symptoms into L LE when symptoms worst (after doing  chores), no B symptoms. Denies numbness/tingling, describes as burning for LE symptoms. No saddle anesthesia or bowel/bladder issues (see red flag section). States she usually enjoys being fairly active with her children, including jumping on trampoline and taking walks but has had difficulty doing this due to her symptoms.   PAIN:  Are you having pain: no pain today Location/description: low back, SIJ; sometimes refers into L LE to toes (laterally) Best-worst over past week: 5-8/10 Per eval -  - aggravating factors: driving, cleaning her home, sitting positioning, standing/walking >1hr, lying on L side - Easing factors: resting/lying down     OBJECTIVE: (objective measures completed at initial evaluation unless otherwise dated)  DIAGNOSTIC FINDINGS:  06/03/22 XR:  "AP lateral radiographs lumbar spine reviewed.  No spondylolisthesis.  No  compression fractures.  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  Degenerative disc disease present at L5-S1 to a moderate  degree.  The rest of the disc bases are relatively well-maintained."   PATIENT SURVEYS:  FOTO deferred on eval given time constraints, plan to administer as able/appropriate  07-14-22  FOTO 48% predicted 64%  SCREENING FOR RED FLAGS: Unremarkable red flag screening - describes some chronic constipation issues which she states she is being treated for. States about 8 months ago she began  to develop more frequency of urination and increased urge, denies any loss of control or sensory issues. Educated on monitoring and appropriate actions   COGNITION: Overall cognitive status: Within functional limits for tasks assessed                          SENSATION/NEURO: Light touch intact B LE aside from mildly diminished L S1 (lateral ankle) No clonus BIL LE  Negative hoffmann and tromner sign  No ataxia with gait     POSTURE: grossly WNL     LUMBAR ROM:    AROM eval  Flexion    Extension    Right lateral flexion    Left lateral flexion    Right rotation seated 75% L QL pain  Left rotation seated 50% *   (Blank rows = not tested) (Key: WFL = within functional limits not formally assessed, * = concordant pain, s = stiffness/stretching sensation, NT = not tested)    LOWER EXTREMITY ROM:      Active  Right eval Left eval  Hip flexion      Hip extension      Hip internal rotation      Hip external rotation      Knee extension      Knee flexion      (Blank rows = not tested) (Key: WFL = within functional limits not formally assessed, * = concordant pain, s = stiffness/stretching sensation, NT = not tested)  Comments:     LOWER EXTREMITY MMT:     MMT Right eval Left eval  Hip flexion 4 3+ *  Hip abduction (modified sitting) 4 4  Hip internal rotation      Hip external rotation      Knee flexion 4 4-   Knee extension 4 4- *  Ankle dorsiflexion 4 4    (Blank rows = not tested) (Key: WFL = within functional limits not formally assessed, * = concordant pain, s = stiffness/stretching sensation, NT = not tested)  Comments:     LUMBAR SPECIAL TESTS:  Slump test: + BIL, L more positive than R (pulling in low back on R,  back pain + LE pain on L)   FUNCTIONAL TESTS:  5xSTS: 24.96 sec gentle UE support, increased low back pain   GAIT: Distance walked: within clinic Assistive device utilized: None Level of assistance: Complete Independence Comments: gait  mechanics grossly WNL, mildly reduced gait speed and cadence    Vitals 07/21/22: HR 68-71, SpO2 96-97% RA, BP 127/91; no symptoms in session  Vitals 07/23/22:  HR 60s SpO2 98-99% RA  Vitals 07/27/22: HR 68-74bpm, SpO2 99% RA; monitored throughout and stable   TODAY'S TREATMENT:      [facility] Adult PT Treatment:                                                DATE: 07/27/22 Therapeutic Exercise: LTR x8 BIL cues for setup DKTC x8 cues for setup Hamstring rollout physioball, supine, 2x12 cues for form/pacing STS 10# x10, STS 15# x8 cues for form and B[employer] Suitcase carry 10# 3x65ft BIL cues for form and pacing  Seated chest press 2kg ball, 2x8 cues for form and pacing, posture  Seated swiss ball rollout 3 way x10 each cues for appropriate plane of movement HEP update, handout + education   [facility] Adult PT Treatment:                                                DATE: 07/23/22 Therapeutic Exercise: LTR 2x5 BIL DKTC x8 Bridge + GTB abduction 3x6 cues for form  Seated swiss ball press down 2x10 cues for breath control and reduced compensations Sidebending iso, sidebending w/ swiss ball each way 2x8 cues for form and reduced compensations    [facility] Adult PT Treatment:                                                DATE: 07/21/22 Therapeutic Exercise: SKTC x3 BIL 10sec hold cues for form  Supine ER stretch 5x10sec hold Bridge + adductor iso 2x8 cues for setup and form  Seated ER/IR GTB w/ ball as fulcrum 2x8 each cues for form and posture   Swiss ball press down standing 2x10 cues for breath control and form Seated hip abd GTB cues for form x10 HEP handout + education  - also spent time discussing new report of shoulder/chest pain, see assessment below. Education on symptom journal, ED if symptoms worsen, calling MD after leaving today's session                                                                                                                      PATIENT EDUCATION:  Education details:  rationale for interventions,  HEP  Person educated: Patient Education method: Explanation, Demonstration, Tactile cues, Verbal cues Education comprehension: verbalized understanding, returned demonstration, verbal cues required, tactile cues required, and needs further education     HOME EXERCISE PROGRAM: Access Code: [email or web address] URL: [email or web address] Date: 07/27/2022 Prepared by: Fransisco Hertzavid Alysson Geist  Exercises - Supine Lower Trunk Rotation  - 1 x daily - 7 x weekly - 1 sets - 5 reps - 20 sec hold hold - Supine Bridge with Resistance Band  - 1 x daily - 7 x weekly - 3 sets - 5 reps - Supine Double Knee to Chest Modified  - 1 x daily - 7 x weekly - 2 sets - 8 reps - 5sec hold - Sit to Stand  - 1 x daily - 7 x weekly - 2 sets - 12 reps  ASSESSMENT:   CLINICAL IMPRESSION: 07/27/2022 Pt arrives without pain, states her back and shoulder much improved after last session and no more instances of chest pain, sees MD this week. Today focusing on progression back to more strength focused program given improved symptoms, pt tolerates well with intermittent cues as above. Vitals monitored and WNL, no adverse events. Denies any pain or increase in symptoms throughout, notes some muscular fatigue with exercises. HEP updated as above. Recommend continuing along current POC in order to address relevant deficits and improve functional tolerance.  Pt departs today's session in no acute distress, all voiced questions/concerns addressed appropriately from PT perspective.     EVAL- Patient is a pleasant 47 y.o. woman who was seen today for physical therapy evaluation and treatment for chronic low back pain with LLE referral. 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 Red flag screening reassuring, see above, education provided. On exam concordant pain is elicited with lumbar rotation ROM and  knee/hip MMT, mild sensory deficits S1 dermatome and + slump BIL (L more so than R). 5xSTS time indicative of fall risk. Pt endorses improved symptoms after examination, no adverse events. At present would recommend skilled PT to address aforementioned deficits to maximize functional independence. Pt verbalizes agreement/understanding with plan. Pt departs today's session in no acute distress, all voiced questions/concerns addressed appropriately from PT perspective.        OBJECTIVE IMPAIRMENTS: decreased activity tolerance, decreased endurance, decreased mobility, difficulty walking, decreased ROM, decreased strength, hypomobility, and pain.    ACTIVITY LIMITATIONS: carrying, lifting, bending, sitting, standing, squatting, stairs, transfers, locomotion level, and caring for others   PARTICIPATION LIMITATIONS: meal prep, cleaning, laundry, driving, community activity, occupation, and yard work   PERSONAL FACTORS: Time since onset of injury/illness/exacerbation are also affecting patient's functional outcome.    REHAB POTENTIAL: Good   CLINICAL DECISION MAKING: Evolving/moderate complexity   EVALUATION COMPLEXITY: Moderate     GOALS: Goals reviewed with patient? No   SHORT TERM GOALS: Target date: 07/28/2022   Pt will demonstrate appropriate understanding and performance of initially prescribed HEP in order to facilitate improved independence with management of symptoms.  Baseline: HEP provided on eval Goal status: INITIAL    2. Pt will score greater than or equal to MCID on FOTO in order to demonstrate improved perception of function due to symptoms.            Baseline: Deferred on eval, will assess as able/appropriate            Goal status: INITIAL     LONG TERM GOALS: Target date: 08/25/2022  Pt will meet predicted score on FOTO in order to demonstrate improved perception of functional status due to symptoms.  Baseline: deferred on eval, will assess as able/appropriate Goal  status: INITIAL   2.  Pt will demonstrate grossly symmetrical lumbar rotation AROM with less than 2 pt increase in pain on NPS in order to demonstrate improved tolerance to functional movement patterns.  Baseline: see ROM chart above Goal status: INITIAL   3.  Pt will demonstrate improvement in global LLE MMT of at least 1 pt in order to demonstrate improved strength for functional movements.  Baseline: see MMT chart above Goal status: INITIAL   4. Pt will perform 5xSTS in </= 14 sec in order to demonstrate reduced fall risk and improved functional independence. (MCID of 2.3sec)            Baseline: 24.96 sec with gentle UE support and increase in pain            Goal status: INITIAL    5. Pt will endorse overall pain reduction of at least 50% for improved tolerance to daily activities.             Baseline: 5-8/10 on NPS            Goal status: INITIAL   6. Pt will be independent w/ final prescribed HEP in order to promote improved self management of symptoms.             Baseline: TBD            Goal status: INITIAL    PLAN:   PT FREQUENCY: 2x/week   PT DURATION: 8 weeks   PLANNED INTERVENTIONS: Therapeutic exercises, Therapeutic activity, Neuromuscular re-education, Balance training, Gait training, Patient/Family education, Self Care, Joint mobilization, Joint manipulation, Stair training, Aquatic Therapy, Dry Needling, Electrical stimulation, Spinal manipulation, Spinal mobilization, Cryotherapy, Moist heat, Taping, Manual therapy, and Re-evaluation.   PLAN FOR NEXT SESSION: incorporate more hip flexor work. FOTO next session   Ashley Murrain PT, DPT 07/27/2022 12:10 PM

## 2022-07-28 ENCOUNTER — Encounter: Payer: Self-pay | Admitting: Physical Therapy

## 2022-07-28 ENCOUNTER — Ambulatory Visit: Payer: Self-pay | Admitting: Physical Therapy

## 2022-07-28 DIAGNOSIS — G8929 Other chronic pain: Secondary | ICD-10-CM

## 2022-07-28 DIAGNOSIS — M6281 Muscle weakness (generalized): Secondary | ICD-10-CM

## 2022-07-28 DIAGNOSIS — R2689 Other abnormalities of gait and mobility: Secondary | ICD-10-CM

## 2022-07-29 ENCOUNTER — Encounter: Payer: Self-pay | Admitting: Physician Assistant

## 2022-07-29 ENCOUNTER — Ambulatory Visit: Payer: Self-pay | Attending: Physician Assistant | Admitting: Physician Assistant

## 2022-07-29 ENCOUNTER — Other Ambulatory Visit: Payer: Self-pay

## 2022-07-29 ENCOUNTER — Other Ambulatory Visit (HOSPITAL_COMMUNITY)
Admission: RE | Admit: 2022-07-29 | Discharge: 2022-07-29 | Disposition: A | Discharge status: Home / Self Care | Payer: Self-pay | Source: Ambulatory Visit | Attending: Physician Assistant | Admitting: Physician Assistant

## 2022-07-29 ENCOUNTER — Ambulatory Visit: Payer: Self-pay | Admitting: Physical Therapy

## 2022-07-29 VITALS — BP 125/85 | HR 71 | Ht 66.5 in | Wt 171.0 lb

## 2022-07-29 DIAGNOSIS — E876 Hypokalemia: Secondary | ICD-10-CM

## 2022-07-29 DIAGNOSIS — R8290 Unspecified abnormal findings in urine: Secondary | ICD-10-CM

## 2022-07-29 DIAGNOSIS — R7303 Prediabetes: Secondary | ICD-10-CM

## 2022-07-29 DIAGNOSIS — Z758 Other problems related to medical facilities and other health care: Secondary | ICD-10-CM

## 2022-07-29 DIAGNOSIS — M7521 Bicipital tendinitis, right shoulder: Secondary | ICD-10-CM

## 2022-07-29 DIAGNOSIS — K648 Other hemorrhoids: Secondary | ICD-10-CM

## 2022-07-29 DIAGNOSIS — R079 Chest pain, unspecified: Secondary | ICD-10-CM

## 2022-07-29 DIAGNOSIS — Z603 Acculturation difficulty: Secondary | ICD-10-CM

## 2022-07-29 DIAGNOSIS — K5900 Constipation, unspecified: Secondary | ICD-10-CM

## 2022-07-29 LAB — POCT URINALYSIS DIP (CLINITEK)
Bilirubin, UA: NEGATIVE
Blood, UA: NEGATIVE
Glucose, UA: NEGATIVE mg/dL
Ketones, POC UA: NEGATIVE mg/dL
Leukocytes, UA: NEGATIVE
Nitrite, UA: NEGATIVE
POC PROTEIN,UA: NEGATIVE
Spec Grav, UA: 1.025 (ref 1.010–1.025)
Urobilinogen, UA: 0.2 E.U./dL
pH, UA: 5.5 (ref 5.0–8.0)

## 2022-07-29 MED ORDER — DICLOFENAC SODIUM 75 MG PO TBEC
75.0000 mg | DELAYED_RELEASE_TABLET | Freq: Two times a day (BID) | ORAL | 0 refills | Status: DC
Start: 2022-07-29 — End: 2024-01-07
  Filled 2022-07-29: qty 30, 15d supply, fill #0

## 2022-07-29 MED ORDER — DICLOFENAC SODIUM 1 % EX GEL
2.0000 g | Freq: Four times a day (QID) | CUTANEOUS | 1 refills | Status: DC
Start: 2022-07-29 — End: 2024-01-07
  Filled 2022-07-29: qty 100, 13d supply, fill #0

## 2022-07-29 MED ORDER — HYDROCORTISONE ACETATE 25 MG RE SUPP
25.0000 mg | Freq: Two times a day (BID) | RECTAL | 1 refills | Status: DC
Start: 2022-07-29 — End: 2024-01-07
  Filled 2022-07-29: qty 12, 6d supply, fill #0

## 2022-07-29 NOTE — Patient Instructions (Addendum)
Tenosinovitis y tendinitis proximal del bceps Proximal Biceps Tendinitis and Tenosynovitis  El tendn proximal del bceps es un cordn resistente de tejido que conecta el msculo del bceps, en el frente de la parte superior del brazo, al omplato. La tendinitis es la inflamacin de un tendn. La tenosinovitis es la inflamacin de la membrana que rodea el tendn (vaina del tendn). Estas afecciones suelen ocurrir al [employer] tiempo y pueden dificultar la flexin del codo o la rotacin de la palma de la mano hacia arriba (supinacin). La tendinitis proximal del bceps y la tenosinovitis, por lo general, se deben al uso excesivo de la articulacin del hombro y el msculo del bceps. Estas afecciones suelen curarse en 6 semanas. La tendinitis proximal del bceps puede tambin implicar un esguince de grado 1 o 2 del tendn. Una distensin de [place] 1 es leve. Hay un leve tirn del tendn sin estiramiento ni desgarro notorio del tendn. Por lo general, no implica la prdida de fuerza en el msculo del bceps. Una distensin de [place] 2 es [place]. Hay un pequeo desgarro en el tendn. El tendn se estira y la fuerza del msculo del bceps disminuye. Cules son las causas? Esta afeccin puede ser causada por lo siguiente: Aumento repentino de la cantidad de actividades en las que [place] el codo y el msculo bceps. El aumento repentino de la intensidad de las actividades tambin puede causar esta afeccin. Uso excesivo del msculo del bceps. Esto puede suceder al [employer] mismo movimiento una y [place], por ejemplo: Girar la palma de la mano [place] arriba. Enderezamiento forzado del codo. Flexionar el codo. Un golpe fuerte y Engineering geologist en el codo o una lesin en el codo. Esto es poco frecuente. Qu incrementa el riesgo? Los siguientes factores pueden hacer que sea ms propenso a Aeronautical engineer afeccin: Microbiologist de contacto. Practicar deportes que impliquen realizar lanzamientos o movimientos por  encima del hombro, como deportes de [place], gimnasia, levantamiento de pesas o fisicoculturismo. Realizar tareas que impliquen actividad fsica. Tener poca fuerza y flexibilidad en el brazo y [place]. Cules son los signos o sntomas? Los sntomas de esta afeccin pueden incluir los siguientes: Dolor e inflamacin en la parte de adelante del hombro. Sensacin de calor en la parte de adelante del hombro. Imposibilidad de mover el hombro y el codo. Esto se denomina amplitud de movimiento limitada. Un crujido (crepitacin) al mover o tocar el hombro o la parte superior del brazo. En algunos casos, los sntomas pueden volver a aparecer despus del tratamiento y pueden perdurar (ser crnicos). Cmo se diagnostica? Esta afeccin se diagnostica en funcin de lo siguiente: Los sntomas. Sus antecedentes mdicos. Un examen fsico. Radiografa, resonancia magntica (RM) o ecografa, si es necesario. Cmo se trata? El tratamiento de esta afeccin depende de la gravedad de la lesin. Puede incluir lo siguiente: Leisure centre manager. Aplicar hielo en la zona de la lesin. Realizar fisioterapia. El mdico tambin podr indicar lo siguiente: Medicamentos para tratar Chief Technology Officer y la inflamacin. Ondas sonoras para tratar el msculo lesionado (terapia por ultrasonido). Medicamentos que se Estate agent (corticoesteroides). Medicamentos para adormecer la zona (anestsicos locales). Ciruga. Esta se realiza si los dems tratamientos no han funcionado. Siga estas indicaciones en su casa: Control del dolor, la rigidez y la hinchazn     Aplique hielo sobre la zona lesionada si se lo indican. Ponga el hielo en una bolsa plstica. Coloque una toalla entre la piel y Copy. Aplique el hielo durante  20 minutos, 2 o 3 veces por da. Si se lo indican, aplique calor en la zona afectada antes de hacer ejercicio. Use la fuente de calor que el mdico le recomiende, como una compresa de  calor hmedo o una almohadilla trmica. Coloque una toalla entre la piel y la fuente de Airline pilotcalor. Aplique calor durante 20 a 30 minutos. Si la piel se le pone de color rojo brillante, retire el hielo o el calor de inmediato para evitar daos en la zona. El Sulphurriesgo de dao es mayor si no puede sentir dolor, Airline pilotcalor o fro. Mueva los dedos de la mano con frecuencia para reducir la rigidez y la hinchazn. Mantenga la zona de la lesin levantada (elevada) por encima del nivel del corazn mientras est acostado. Actividad Es posible que deba Medical illustratorevitar levantar objetos. Pregntele al mdico cunto peso puede levantar sin correr Dover Corporationriesgos. Evite las SUPERVALU INCactividades que le causen dolor o empeoren la afeccin. Haga ejercicios como se lo haya indicado el mdico. Retome sus actividades normales como se lo haya indicado el mdico. Pregntele al mdico qu actividades son seguras para usted. Indicaciones generales Use los medicamentos de venta libre y los recetados solamente como se lo haya indicado el mdico. No consuma ningn producto que contenga nicotina o tabaco. Estos productos incluyen cigarrillos, tabaco para Theatre managermascar y aparatos de vapeo, como los Administrator, Civil Servicecigarrillos electrnicos. Estos pueden retrasar la recuperacin. Si necesita ayuda para dejar de consumir estos productos, consulte al mdico. Concurra a todas las visitas de seguimiento. El [address],Third Floormdico har un control de su lesin y de [place] actividad. Cmo se previene? Precaliente y elongue adecuadamente antes de hacer actividad fsica. Reljese y elongue despus de hacer actividad fsica. Dele al cuerpo tiempo para Saks Incorporateddescansar entre los perodos de [place]actividad fsica. Asegrese de usar el equipo que sea apto para usted. Tome medidas de seguridad y sea responsable al hacer Ether Griffinsuna actividad, para evitar las cadas. Mantenga un buen estado fsico, que incluye lo siguiente: [place]uerza. Flexibilidad. Salud del corazn (buen estado cardiovascular). La capacidad de usar msculos durante un  largo tiempo (resistencia). Comunquese con un mdico si: Los sntomas empeoran o no mejoran despus de 2 semanas de tratamiento. Tiene nuevos sntomas. Siente dolor intenso. Esta informacin no tiene Theme park managercomo fin reemplazar el consejo del mdico. Asegrese de hacerle al mdico cualquier pregunta que tenga. Document Revised: 12/09/2021 Document Reviewed: 12/09/2021 Elsevier Patient Education  2023 Elsevier Inc. Estreimiento crnico Chronic Constipation El estreimiento crnico es cuando una persona defeca 3 veces o menos a la semana durante 3 meses o ms tiempo. Es ms frecuente en adultos de edad avanzada. Cules son las causas? Esta afeccin puede ser causada por lo siguiente: No beber suficiente cantidad de lquido, no comer suficiente fibra o no hacer actividad fsica. Embarazo. Una fisura anal. Este es un desgarro en la abertura del trasero (ano). Obstruccin intestinal. Esto es cuando hay una obstruccin en el intestino. Estenosis intestinal. Esto es cuando el intestino se estrecha. Neomia DearUna afeccin a largo plazo (crnica), como las siguientes: Diabetes o hipotiroidismo. Un accidente cerebrovascular o una lesin en la mdula espinal. Esclerosis mltiple o enfermedad de Parkinson. Cncer de colon. Demencia. Enfermedad inflamatoria del intestino (EII), prolapso rectal o hemorroides. Tomar ciertos medicamentos. Estas incluyen las siguientes: Algunos analgsicos. Anticidos o suplementos de hierro. Medicamentos para eliminar lquidos (diurticos). Algunos medicamentos para la presin arterial. Medicamentos para evitar que tenga convulsiones (anticonvulsivos). Antidepresivos. Medicamentos para la enfermedad de Parkinson. Algunas otras causas son las siguientes: Librarian, academicstrs. Problemas en los nervios y los msculos que le ayudan  a defecar. Debilidad en los msculos que estn entre los huesos de la cadera (pelvis). Qu incrementa el riesgo? Usted puede estar en mayor riesgo de padecer esta  afeccin si: Es mayor de 84 aos. Es mujer. Vive en un centro de atencin a Air cabin crew. Tiene una afeccin crnica. Tiene un trastorno de salud mental o un trastorno alimenticio. Cules son los signos o sntomas? El principal signo de esta afeccin es defecar 3 veces o menos a la semana. Otros signos y sntomas pueden incluir los siguientes: Hacer mucha fuerza (esfuerzo) para Advertising copywriter. Deposiciones (heces) duras o grumosas. Dolor al defecar. Molestias en la parte baja del abdomen. Estas pueden incluir clicos e hinchazn. No poder defecar cuando lo necesita. Sentir que Psychologist, counselling an despus de haber terminado. Sentir que tiene algo en el recto que lo bloquea o Health and safety inspector. Sangre en las heces o sangre en el papel higinico despus de defecar. Confusin. Esto es ms frecuente en adultos de edad avanzada. Cmo se diagnostica? Esta afeccin puede diagnosticarse en funcin de los sntomas y los antecedentes mdicos. Tambin podra necesitar lo siguiente: Un examen del abdomen. Examen rectal digital. Para este examen, el proveedor de atencin mdica le introducir un dedo enguantado en el recto. Estudios. Las pruebas pueden incluir las siguientes: Estudios de diagnstico por imgenes, como una radiografa, una ecografa o una exploracin por tomografa computarizada (TC). Anlisis de [place]. Colonoscopia. Esta es una prueba en la que se examina el colon. Una prueba que se hace para saber cmo funciona el esfnter anal. Este es un msculo con forma de anillo. Ayuda a que el ano se abra y se cierre. Una prueba que se hace para saber cmo se mueven los alimentos por el colon. Una electromiografa. Esta es una prueba para revisar los nervios de la pelvis. Cmo se trata? El tratamiento depende de la causa. Es posible que deba hacer lo siguiente: Sea ms [place]. Beba ms lquidos. Agregue fibra a su dieta. Las fuentes de fibra incluyen frutas, verduras y Radiation protection practitioner. Es  posible que tambin necesite tomar un suplemento de [place]. Dejar de tomar o [employer] medicamentos que pueden causar estreimiento. Tomar medicamentos, como: Procinticos. Estos ayudan a que los msculos se tensen (contraigan). Esto puede ayudarlo a [employer] Corporation. Ablandadores de heces. Estos ablandan las heces. Un laxante osmtico. Un laxante es un medicamento que ayuda a defecar. La accin de este tipo de laxante es incorporar agua al colon. Entrenar los msculos de la pelvis con biorretroalimentacin. Realizar Neomia Dear ciruga. Esta se puede hacer si algo est bloqueando los intestinos. Es posible que tambin Programmer, multimedia a un experto en problemas del aparato digestivo (gastroenterlogo). Siga estas instrucciones en su casa: Medicamentos Use los medicamentos de venta libre y los recetados solamente como se lo haya indicado el mdico. Si est tomando un laxante, tmelo nicamente como se lo haya indicado el mdico. Comida y bebida  [place] suficiente lquido para Pharmacologist el pis (orina) de color amarillo plido. Consumir alimentos ricos en fibra, como frijoles, cereales integrales, y frutas y verduras frescas. Limitar el consumo de alimentos ricos en grasa y azcares procesados, como los alimentos fritos o dulces. No consumir alcohol, cafena ni refrescos. Estos pueden Scientist, research (life sciences). Instrucciones generales Walt Disney. Pregntele al mdico qu actividades son seguras para usted. Somtase a estudios de Probation officer de colon segn se lo indique el mdico. Comunquese con un mdico si: Defeca 3 veces o menos a la semana. Sus heces son  duras o grumosas. Hay sangre en el papel higinico o en las heces. [place]aja de peso sin motivo claro. Siente dolor en el recto. Las heces se le escapan del recto. Tiene nuseas o vmitos. Esta informacin no tiene Theme park manager el consejo del mdico. Asegrese de hacerle al mdico cualquier pregunta que  tenga. Document Revised: 02/14/2022 Document Reviewed: 02/14/2022 Elsevier Patient Education  2023 [employer].

## 2022-07-29 NOTE — Progress Notes (Signed)
Patient ID: Tracy Blevins, female   DOB: 1975/09/24, 47 y.o.   MRN: [id number]    Aavya Verrastro, is a 47 y.o. female  KDT:[id number]  [id number]  DOB - 11-02-1975  Chief Complaint  Patient presents with   burning during urination        Subjective:   Tracy Blevins is a 47 y.o. female here today for Urination pain for about 1 month.  [employer] tablets helped this resolve and she has not had to take one in about a week and urinary symptoms have resolved.  No vaginal discharge.    R arm for about 2 weeks.  Pain is in the area of the mid biceps.  She is R hand dominant.  She denies injury.  No new activities.  No loss of strength or weakness.  Has not taken anything for it.    pain in chest in L breast area about 1-2 weeks ago.  It would last for a few seconds and go away.  It felt like a burning and stinging type pain.  It was fleeting.  No associated SOB.  No pressure  Continues to have issues with constipation and now has a small hemorrhoid after straining for a BM that is not painful or itching. Colonoscopy 2 years ago.    Wants to recheck A1C.  She has been trying to change her iet and reduce sugar intake    No problems updated.  ALLERGIES: Allergies  Allergen Reactions   Hydrocodone Nausea And Vomiting and Other (See Comments)    Dizziness     PAST MEDICAL HISTORY: Past Medical History:  Diagnosis Date   Headache    Hypothyroidism    S/P appy    S/P tubal ligation     MEDICATIONS AT HOME: Prior to Admission medications   Medication Sig Start Date End Date Taking? Authorizing Provider  diclofenac Sodium (VOLTAREN) 1 % GEL Apply 2 g topically 4 (four) times daily. 07/29/22  Yes Aveon Colquhoun M, PA-C  hydrocortisone (ANUSOL-HC) 25 MG suppository Place 1 suppository (25 mg total) rectally 2 (two) times daily. 07/29/22  Yes Anders Simmonds, PA-C  vitamin E 100 UNIT capsule Take by mouth daily.   Yes [provider]  diclofenac (VOLTAREN)  75 MG EC tablet Take 1 tablet (75 mg total) by mouth 2 (two) times daily. Prn pain 07/29/22   Anders Simmonds, PA-C  meclizine (ANTIVERT) 25 MG tablet Take 25 mg by mouth 3 (three) times daily as needed. Patient not taking: Reported on 07/29/2022 04/30/22   [provider]  ondansetron (ZOFRAN-ODT) 4 MG disintegrating tablet Take 4 mg by mouth every 8 (eight) hours as needed. Patient not taking: Reported on 06/04/2022 04/30/22   [provider]  polyethylene glycol powder (GLYCOLAX/MIRALAX) 17 GM/SCOOP powder Mix 17 g with 4-8 ounces of a beverage and take by mouth 2 (two) times daily as needed. Patient not taking: Reported on 07/29/2022 12/03/21   Anders Simmonds, PA-C    ROS: Neg HEENT Neg resp Neg cardiac Neg GI Neg GU Neg MS Neg psych Neg neuro  Objective:   Vitals:   07/29/22 0911  BP: 125/85  Pulse: 71  SpO2: 97%  Weight: 171 lb (77.6 kg)  Height: 5' 6.5" (1.689 m)   Exam General appearance : Awake, alert, not in any distress. Speech Clear. Not toxic looking HEENT: Atraumatic and Normocephalic Neck: Supple, no JVD. No cervical lymphadenopathy.  Chest: Good air entry bilaterally, CTAB.  No rales/rhonchi/wheezing.  Chest wall above L breast with mild TTP.   CVS: S1 S2 regular, no murmurs.  R&LUE examined and compared.  Full S&ROM.  Normal grip and ROM with B hands and elbows.  TTP R proximal biceps tendon and decreased ROM(~90% normal in R shoulder)  neg speeds/empty can test Extremities: B/L Lower Ext shows no edema, both legs are warm to touch Neurology: Awake alert, and oriented X 3, CN II-XII intact, Non focal Skin: No Rash  Data Review Lab Results  Component Value Date   HGBA1C 5.8 (H) 04/22/2022   HGBA1C 5.7 (A) 02/10/2022    Assessment & Plan   1. Biceps tendinitis of right upper extremity - diclofenac (VOLTAREN) 75 MG EC tablet; Take 1 tablet (75 mg total) by mouth 2 (two) times daily. Prn pain  Dispense: 30 tablet; Refill: 0 - diclofenac  Sodium (VOLTAREN) 1 % GEL; Apply 2 g topically 4 (four) times daily.  Dispense: 100 g; Refill: 1  2. Abnormal urine odor Urine is normal - POCT URINALYSIS DIP (CLINITEK)  3. Prediabetes I have had a lengthy discussion and provided education about insulin resistance and the intake of too much sugar/refined carbohydrates.  I have advised the patient to work at a goal of eliminating sugary drinks, candy, desserts, sweets, refined sugars, processed foods, and white carbohydrates.  The patient expresses understanding.  - Hemoglobin A1c  4. Chest pain, unspecified type Has resolved.   - diclofenac (VOLTAREN) 75 MG EC tablet; Take 1 tablet (75 mg total) by mouth 2 (two) times daily. Prn pain  Dispense: 30 tablet; Refill: 0 - diclofenac Sodium (VOLTAREN) 1 % GEL; Apply 2 g topically 4 (four) times daily.  Dispense: 100 g; Refill: 1  5. Constipation, unspecified constipation type Educational infor  6. Language barrier [employer] "Angelica" interpreters used and additional time performing visit was required.   7. Other hemorrhoids - hydrocortisone (ANUSOL-HC) 25 MG suppository; Place 1 suppository (25 mg total) rectally 2 (two) times daily.  Dispense: 12 suppository; Refill: 1  8. Hypokalemia - Basic metabolic panel    Return in about 3 months (around 10/28/2022) for PCP for chronic conditions.  The patient was given clear instructions to go to ER or return to medical center if symptoms don't improve, worsen or new problems develop. The patient verbalized understanding. The patient was told to call to get lab results if they haven't heard anything in the next week.      Georgian Co, PA-C [facility] [place], [place] [phone]   07/29/2022, 9:35 AM

## 2022-07-30 ENCOUNTER — Other Ambulatory Visit: Payer: Self-pay

## 2022-07-30 ENCOUNTER — Other Ambulatory Visit: Payer: Self-pay | Admitting: Physician Assistant

## 2022-07-30 LAB — BASIC METABOLIC PANEL
BUN/Creatinine Ratio: 27 — ABNORMAL HIGH (ref 9–23)
BUN: 16 mg/dL (ref 6–24)
CO2: 21 mmol/L (ref 20–29)
Calcium: 8.8 mg/dL (ref 8.7–10.2)
Chloride: 105 mmol/L (ref 96–106)
Creatinine, Ser: 0.6 mg/dL (ref 0.57–1.00)
Glucose: 84 mg/dL (ref 70–99)
Potassium: 4.8 mmol/L (ref 3.5–5.2)
Sodium: 139 mmol/L (ref 134–144)
eGFR: 113 mL/min/{1.73_m2} (ref >59)

## 2022-07-30 LAB — CERVICOVAGINAL ANCILLARY ONLY
Bacterial Vaginitis (gardnerella): NEGATIVE
Candida Glabrata: NEGATIVE
Candida Vaginitis: POSITIVE — AB
Chlamydia: NEGATIVE
Comment: NEGATIVE
Comment: NEGATIVE
Comment: NEGATIVE
Comment: NEGATIVE
Comment: NEGATIVE
Comment: NORMAL
Neisseria Gonorrhea: NEGATIVE
Trichomonas: NEGATIVE

## 2022-07-30 LAB — HEMOGLOBIN A1C
Est. average glucose Bld gHb Est-mCnc: 123 mg/dL
Hgb A1c MFr Bld: 5.9 % — ABNORMAL HIGH (ref 4.8–5.6)

## 2022-07-30 MED ORDER — FLUCONAZOLE 150 MG PO TABS
150.0000 mg | ORAL_TABLET | Freq: Once | ORAL | 1 refills | Status: AC
  Filled 2022-07-30: qty 1, 1d supply, fill #0

## 2022-08-03 ENCOUNTER — Ambulatory Visit: Payer: Self-pay | Admitting: Physical Therapy

## 2022-08-04 ENCOUNTER — Encounter: Payer: Self-pay | Admitting: *Deleted

## 2022-08-04 NOTE — Therapy (Signed)
OUTPATIENT PHYSICAL THERAPY TREATMENT NOTE   Patient Name: Tracy Blevins MRN: [id number] DOB:[birth year], 47 y.o., female Today's Date: 08/05/2022  PCP: Marcine Matar, MD  REFERRING PROVIDER: Cammy Copa, MD   END OF SESSION:   PT End of Session - 08/05/22 1102     Visit Number 8    Number of Visits 17    Date for PT Re-Evaluation 08/25/22    Authorization Type [employer] 100%    Authorization Time Period 05/08/22 to 11/06/22    PT Start Time 1102    PT Stop Time 1145    PT Time Calculation (min) 43 min    Activity Tolerance Patient tolerated treatment well;No increased pain    Behavior During Therapy WFL for tasks assessed/performed             Past Medical History:  Diagnosis Date   Headache    Hypothyroidism    S/P appy    S/P tubal ligation    Past Surgical History:  Procedure Laterality Date   APPENDECTOMY  2007   CESAREAN SECTION  2000   CESAREAN SECTION  2008   TUBAL LIGATION  2008   Patient Active Problem List   Diagnosis Date Noted   Chronic left-sided low back pain with left-sided sciatica 02/10/2022   Irregular periods 02/10/2022   Influenza vaccine needed 07/18/2020   Need for tetanus, diphtheria, and acellular pertussis (Tdap) vaccine 07/18/2020   Screening breast examination 02/14/2019   Breast pain, right 02/14/2019    REFERRING DIAG: Radiculopathy, lumbar region   THERAPY DIAG:  Chronic low back pain with left-sided sciatica, unspecified back pain laterality  Muscle weakness (generalized)  Other abnormalities of gait and mobility  Rationale for Evaluation and Treatment Rehabilitation  PERTINENT HISTORY: Unremarkable PMH   PRECAUTIONS: none  SUBJECTIVE:                                                                                                                                                                                      SUBJECTIVE STATEMENT:  08/05/2022 Pt arrives w/o pain, states she has been doing well  since last visit and hasn't really had any pain since Friday. Continues with some fatigue with heavier activities. Primary report of pain with prolonged driving that radiates into lateral LLE  Per eval - Appreciate assistance of video interpreter throughout. Pt states ongoing back pain for past 15 years, states that when she was younger she fell down the stairs and landed on her buttocks.  States she has been seeing a specialist, received imaging and an injection (two weeks ago). Pt states that she hasn't really had much relief from injection, maybe a  little.  Occasional symptoms into L LE when symptoms worst (after doing chores), no B symptoms. Denies numbness/tingling, describes as burning for LE symptoms. No saddle anesthesia or bowel/bladder issues (see red flag section). States she usually enjoys being fairly active with her children, including jumping on trampoline and taking walks but has had difficulty doing this due to her symptoms.   PAIN:  Are you having pain: no pain today Location/description: low back, SIJ; sometimes refers into L LE to toes (laterally) Best/worst over past week: 0-5/10  Per eval -  Best-worst over past week: 5-8/10 - aggravating factors: driving, cleaning her home, sitting positioning, standing/walking >1hr, lying on L side - Easing factors: resting/lying down     OBJECTIVE: (objective measures completed at initial evaluation unless otherwise dated)  DIAGNOSTIC FINDINGS:  06/03/22 XR:  "AP lateral radiographs lumbar spine reviewed.  No spondylolisthesis.  No  compression fractures.  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  Degenerative disc disease present at L5-S1 to a moderate  degree.  The rest of the disc bases are relatively well-maintained."   PATIENT SURVEYS:  FOTO deferred on eval given time constraints, plan to administer as able/appropriate  07-14-22  FOTO 48% predicted 64% 07/28/22: FOTO 45%    SCREENING FOR RED  FLAGS: Unremarkable red flag screening - describes some chronic constipation issues which she states she is being treated for. States about 8 months ago she began to develop more frequency of urination and increased urge, denies any loss of control or sensory issues. Educated on monitoring and appropriate actions   COGNITION: Overall cognitive status: Within functional limits for tasks assessed                          SENSATION/NEURO: Light touch intact B LE aside from mildly diminished L S1 (lateral ankle) No clonus BIL LE  Negative hoffmann and tromner sign  No ataxia with gait     POSTURE: grossly WNL     LUMBAR ROM:    AROM eval  Flexion    Extension    Right lateral flexion    Left lateral flexion    Right rotation seated 75% L QL pain  Left rotation seated 50% *   (Blank rows = not tested) (Key: WFL = within functional limits not formally assessed, * = concordant pain, s = stiffness/stretching sensation, NT = not tested)    LOWER EXTREMITY ROM:      Active  Right eval Left eval  Hip flexion      Hip extension      Hip internal rotation      Hip external rotation      Knee extension      Knee flexion      (Blank rows = not tested) (Key: WFL = within functional limits not formally assessed, * = concordant pain, s = stiffness/stretching sensation, NT = not tested)  Comments:     LOWER EXTREMITY MMT:     MMT Right eval Left eval  Hip flexion 4 3+ *  Hip abduction (modified sitting) 4 4  Hip internal rotation      Hip external rotation      Knee flexion 4 4-   Knee extension 4 4- *  Ankle dorsiflexion 4 4    (Blank rows = not tested) (Key: WFL = within functional limits not formally assessed, * = concordant pain, s = stiffness/stretching sensation, NT = not tested)  Comments:  LUMBAR SPECIAL TESTS:  Slump test: + BIL, L more positive than R (pulling in low back on R, back pain + LE pain on L)   FUNCTIONAL TESTS:  5xSTS: 24.96 sec gentle UE support,  increased low back pain   GAIT: Distance walked: within clinic Assistive device utilized: None Level of assistance: Complete Independence Comments: gait mechanics grossly WNL, mildly reduced gait speed and cadence    Vitals 07/21/22: HR 68-71, SpO2 96-97% RA, BP 127/91; no symptoms in session  Vitals 07/23/22:  HR 60s SpO2 98-99% RA  Vitals 07/27/22: HR 68-74bpm, SpO2 99% RA; monitored throughout and stable   TODAY'S TREATMENT:      [facility] Adult PT Treatment:                                                DATE: 08/05/22 Therapeutic Exercise: Pyramid STS BW x12 10# x8 15# x5 10# x8 BW x12 CC paloff press 7# 2x8 cues for form and pacing  Suitcase carry 15# 3x12ft BIL cues for posture and pacing Sciatic nerve glides x10, x6 LLE emphasis on pain free ROM, appropriate form Significant time spent discussing appropriate performance of nerve glides with addition to HEP, exercise/activity at home. HEP handout provided   [facility] Adult PT Treatment:                                                DATE: 07/28/22 Therapeutic Exercise: LTR x10 BIL SKTC 5 sec hold x8 BIL Supine ER stretch x8 BIL  Standing QL stretch ball at wall 2x8 BIL cues for setup and form  Swiss ball press down 2x12 cues for breath control HEP discussion, exercise/activity at home   [facility] Adult PT Treatment:                                                DATE: 07/27/22 Therapeutic Exercise: LTR x8 BIL cues for setup DKTC x8 cues for setup Hamstring rollout physioball, supine, 2x12 cues for form/pacing STS 10# x10, STS 15# x8 cues for form and BOS Suitcase carry 10# 3x7ft BIL cues for form and pacing  Seated chest press 2kg ball, 2x8 cues for form and pacing, posture  Seated swiss ball rollout 3 way x10 each cues for appropriate plane of movement HEP update, handout + education                                                                                                                       PATIENT EDUCATION:   Education details: rationale for interventions, HEP, relevant anatomy/physiology  Person educated: Patient Education  method: Explanation, Demonstration, Tactile cues, Verbal cues Education comprehension: verbalized understanding, returned demonstration, verbal cues required, tactile cues required, and needs further education     HOME EXERCISE PROGRAM: Access Code: [email or web address] URL: [email or web address] Date: 08/05/2022 Prepared by: Fransisco Hertz  Exercises - Supine Lower Trunk Rotation  - 1 x daily - 7 x weekly - 1 sets - 5 reps - 20 sec hold hold - Supine Bridge with Resistance Band  - 1 x daily - 7 x weekly - 3 sets - 5 reps - Supine Double Knee to Chest Modified  - 1 x daily - 7 x weekly - 2 sets - 8 reps - 5sec hold - Sit to Stand  - 1 x daily - 7 x weekly - 2 sets - 12 reps - Seated Sciatic Tensioner  - 1 x daily - 7 x weekly - 2 sets - 6 reps  ASSESSMENT:   CLINICAL IMPRESSION: 08/05/2022 Pt arrives w/o pain, states she hasn't really had any pain since last Friday despite doing heavy activities around the house and having some fatigue. Today focusing on progression of strengthening/endurance exercises which pt tolerates quite well, primary report of muscular fatigue. Ended session with significant time spent discussing sciatic nerve glides and appropriate performance, emphasis on desensitization and pain free ROM as she reports primary remaining pain is with prolonged driving. No adverse events, updated HEP to incorporate nerve glides. No pain on departure. If pt continues to progress along current trajectory may consider discharge in coming visits. Pt departs today's session in no acute distress, all voiced questions/concerns addressed appropriately from PT perspective.     EVAL- Patient is a pleasant 47 y.o. woman who was seen today for physical therapy evaluation and treatment for chronic low back pain with LLE referral. 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 Red flag screening reassuring, see above, education provided. On exam concordant pain is elicited with lumbar rotation ROM and knee/hip MMT, mild sensory deficits S1 dermatome and + slump BIL (L more so than R). 5xSTS time indicative of fall risk. Pt endorses improved symptoms after examination, no adverse events. At present would recommend skilled PT to address aforementioned deficits to maximize functional independence. Pt verbalizes agreement/understanding with plan. Pt departs today's session in no acute distress, all voiced questions/concerns addressed appropriately from PT perspective.        OBJECTIVE IMPAIRMENTS: decreased activity tolerance, decreased endurance, decreased mobility, difficulty walking, decreased ROM, decreased strength, hypomobility, and pain.    ACTIVITY LIMITATIONS: carrying, lifting, bending, sitting, standing, squatting, stairs, transfers, locomotion level, and caring for others   PARTICIPATION LIMITATIONS: meal prep, cleaning, laundry, driving, community activity, occupation, and yard work   PERSONAL FACTORS: Time since onset of injury/illness/exacerbation are also affecting patient's functional outcome.    REHAB POTENTIAL: Good   CLINICAL DECISION MAKING: Evolving/moderate complexity   EVALUATION COMPLEXITY: Moderate     GOALS: Goals reviewed with patient? No   SHORT TERM GOALS: Target date: 07/28/2022   Pt will demonstrate appropriate understanding and performance of initially prescribed HEP in order to facilitate improved independence with management of symptoms.  Baseline: HEP provided on eval 07/28/22: pt reports excellent adherence/tolerance to HEP Goal status: MET   2. Pt will score greater than or equal to MCID on FOTO in order to demonstrate improved perception of function due to symptoms.            Baseline: Deferred  on eval, will assess as  able/appropriate  07/28/22: reduction in FOTO score by 3 pts            Goal status: ONGOING   LONG TERM GOALS: Target date: 08/25/2022     Pt will meet predicted score on FOTO in order to demonstrate improved perception of functional status due to symptoms.  Baseline: deferred on eval, will assess as able/appropriate Goal status: INITIAL   2.  Pt will demonstrate grossly symmetrical lumbar rotation AROM with less than 2 pt increase in pain on NPS in order to demonstrate improved tolerance to functional movement patterns.  Baseline: see ROM chart above Goal status: INITIAL   3.  Pt will demonstrate improvement in global LLE MMT of at least 1 pt in order to demonstrate improved strength for functional movements.  Baseline: see MMT chart above Goal status: INITIAL   4. Pt will perform 5xSTS in </= 14 sec in order to demonstrate reduced fall risk and improved functional independence. (MCID of 2.3sec)            Baseline: 24.96 sec with gentle UE support and increase in pain            Goal status: INITIAL    5. Pt will endorse overall pain reduction of at least 50% for improved tolerance to daily activities.             Baseline: 5-8/10 on NPS            Goal status: INITIAL   6. Pt will be independent w/ final prescribed HEP in order to promote improved self management of symptoms.             Baseline: TBD            Goal status: INITIAL    PLAN:   PT FREQUENCY: 2x/week   PT DURATION: 8 weeks   PLANNED INTERVENTIONS: Therapeutic exercises, Therapeutic activity, Neuromuscular re-education, Balance training, Gait training, Patient/Family education, Self Care, Joint mobilization, Joint manipulation, Stair training, Aquatic Therapy, Dry Needling, Electrical stimulation, Spinal manipulation, Spinal mobilization, Cryotherapy, Moist heat, Taping, Manual therapy, and Re-evaluation.   PLAN FOR NEXT SESSION: incorporate more hip flexor work, functional strengthening/endurance. Look at sciatic  nerve glides    Ashley Murrain PT, DPT 08/05/2022 12:07 PM

## 2022-08-05 ENCOUNTER — Encounter: Payer: Self-pay | Admitting: Physical Therapy

## 2022-08-05 ENCOUNTER — Ambulatory Visit: Payer: Self-pay | Admitting: Physical Therapy

## 2022-08-05 DIAGNOSIS — G8929 Other chronic pain: Secondary | ICD-10-CM

## 2022-08-05 DIAGNOSIS — M6281 Muscle weakness (generalized): Secondary | ICD-10-CM

## 2022-08-05 DIAGNOSIS — R2689 Other abnormalities of gait and mobility: Secondary | ICD-10-CM

## 2022-08-06 ENCOUNTER — Other Ambulatory Visit: Payer: Self-pay

## 2022-08-10 ENCOUNTER — Ambulatory Visit: Payer: Self-pay | Admitting: Physical Therapy

## 2022-08-11 NOTE — Therapy (Signed)
OUTPATIENT PHYSICAL THERAPY TREATMENT NOTE   Patient Name: Aynsley Fleet MRN: [id number] DOB:1975-08-11, 47 y.o., female Today's Date: 08/11/2022  PCP: Marcine Matar, MD  REFERRING PROVIDER: Cammy Copa, MD   END OF SESSION:     Past Medical History:  Diagnosis Date   Headache    Hypothyroidism    S/P appy    S/P tubal ligation    Past Surgical History:  Procedure Laterality Date   APPENDECTOMY  2007   CESAREAN SECTION  2000   CESAREAN SECTION  2008   TUBAL LIGATION  2008   Patient Active Problem List   Diagnosis Date Noted   Chronic left-sided low back pain with left-sided sciatica 02/10/2022   Irregular periods 02/10/2022   Influenza vaccine needed 07/18/2020   Need for tetanus, diphtheria, and acellular pertussis (Tdap) vaccine 07/18/2020   Screening breast examination 02/14/2019   Breast pain, right 02/14/2019    REFERRING DIAG: Radiculopathy, lumbar region   THERAPY DIAG:  No diagnosis found.  Rationale for Evaluation and Treatment Rehabilitation  PERTINENT HISTORY: Unremarkable PMH   PRECAUTIONS: none  SUBJECTIVE:                                                                                                                                                                                      SUBJECTIVE STATEMENT:  08/11/2022 ***  *** Pt arrives w/o pain, states she has been doing well since last visit and hasn't really had any pain since Friday. Continues with some fatigue with heavier activities. Primary report of pain with prolonged driving that radiates into lateral LLE  Per eval - Appreciate assistance of video interpreter throughout. Pt states ongoing back pain for past 15 years, states that when she was younger she fell down the stairs and landed on her buttocks.  States she has been seeing a specialist, received imaging and an injection (two weeks ago). Pt states that she hasn't really had much relief from injection, maybe a  little.  Occasional symptoms into L LE when symptoms worst (after doing chores), no B symptoms. Denies numbness/tingling, describes as burning for LE symptoms. No saddle anesthesia or bowel/bladder issues (see red flag section). States she usually enjoys being fairly active with her children, including jumping on trampoline and taking walks but has had difficulty doing this due to her symptoms.   PAIN:  Are you having pain: no pain today Location/description: low back, SIJ; sometimes refers into L LE to toes (laterally) Best/worst over past week: 0-5/10  Per eval -  Best-worst over past week: 5-8/10 - aggravating factors: driving, cleaning her home, sitting positioning, standing/walking >1hr, lying on L side -  Easing factors: resting/lying down     OBJECTIVE: (objective measures completed at initial evaluation unless otherwise dated)  DIAGNOSTIC FINDINGS:  06/03/22 XR:  "AP lateral radiographs lumbar spine reviewed.  No spondylolisthesis.  No  compression fractures.  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  Degenerative disc disease present at L5-S1 to a moderate  degree.  The rest of the disc bases are relatively well-maintained."   PATIENT SURVEYS:  FOTO deferred on eval given time constraints, plan to administer as able/appropriate  07-14-22  FOTO 48% predicted 64% 07/28/22: FOTO 45%    SCREENING FOR RED FLAGS: Unremarkable red flag screening - describes some chronic constipation issues which she states she is being treated for. States about 8 months ago she began to develop more frequency of urination and increased urge, denies any loss of control or sensory issues. Educated on monitoring and appropriate actions   COGNITION: Overall cognitive status: Within functional limits for tasks assessed                          SENSATION/NEURO: Light touch intact B LE aside from mildly diminished L S1 (lateral ankle) No clonus BIL LE  Negative hoffmann and tromner sign   No ataxia with gait     POSTURE: grossly WNL     LUMBAR ROM:    AROM eval  Flexion    Extension    Right lateral flexion    Left lateral flexion    Right rotation seated 75% L QL pain  Left rotation seated 50% *   (Blank rows = not tested) (Key: WFL = within functional limits not formally assessed, * = concordant pain, s = stiffness/stretching sensation, NT = not tested)    LOWER EXTREMITY ROM:      Active  Right eval Left eval  Hip flexion      Hip extension      Hip internal rotation      Hip external rotation      Knee extension      Knee flexion      (Blank rows = not tested) (Key: WFL = within functional limits not formally assessed, * = concordant pain, s = stiffness/stretching sensation, NT = not tested)  Comments:     LOWER EXTREMITY MMT:     MMT Right eval Left eval  Hip flexion 4 3+ *  Hip abduction (modified sitting) 4 4  Hip internal rotation      Hip external rotation      Knee flexion 4 4-   Knee extension 4 4- *  Ankle dorsiflexion 4 4    (Blank rows = not tested) (Key: WFL = within functional limits not formally assessed, * = concordant pain, s = stiffness/stretching sensation, NT = not tested)  Comments:     LUMBAR SPECIAL TESTS:  Slump test: + BIL, L more positive than R (pulling in low back on R, back pain + LE pain on L)   FUNCTIONAL TESTS:  5xSTS: 24.96 sec gentle UE support, increased low back pain   GAIT: Distance walked: within clinic Assistive device utilized: None Level of assistance: Complete Independence Comments: gait mechanics grossly WNL, mildly reduced gait speed and cadence    Vitals 07/21/22: HR 68-71, SpO2 96-97% RA, BP 127/91; no symptoms in session  Vitals 07/23/22:  HR 60s SpO2 98-99% RA  Vitals 07/27/22: HR 68-74bpm, SpO2 99% RA; monitored throughout and stable   TODAY'S TREATMENT:  [facility] Adult PT Treatment:                                                DATE: 08/12/22 Therapeutic Exercise: *** Manual  Therapy: *** Neuromuscular re-ed: *** Therapeutic Activity: *** Modalities: *** Self Care: ***   [facility] Adult PT Treatment:                                                DATE: 08/05/22 Therapeutic Exercise: Pyramid STS BW x12 10# x8 15# x5 10# x8 BW x12 CC paloff press 7# 2x8 cues for form and pacing  Suitcase carry 15# 3x66ft BIL cues for posture and pacing Sciatic nerve glides x10, x6 LLE emphasis on pain free ROM, appropriate form Significant time spent discussing appropriate performance of nerve glides with addition to HEP, exercise/activity at home. HEP handout provided   [facility] Adult PT Treatment:                                                DATE: 07/28/22 Therapeutic Exercise: LTR x10 BIL SKTC 5 sec hold x8 BIL Supine ER stretch x8 BIL  Standing QL stretch ball at wall 2x8 BIL cues for setup and form  Swiss ball press down 2x12 cues for breath control HEP discussion, exercise/activity at home                                                                                                                        PATIENT EDUCATION:  Education details: rationale for interventions, HEP, relevant anatomy/physiology  Person educated: Patient Education method: Explanation, Demonstration, Tactile cues, Verbal cues Education comprehension: verbalized understanding, returned demonstration, verbal cues required, tactile cues required, and needs further education     HOME EXERCISE PROGRAM: Access Code: [email or web address] URL: [email or web address] Date: 08/05/2022 Prepared by: Fransisco Hertz  Exercises - Supine Lower Trunk Rotation  - 1 x daily - 7 x weekly - 1 sets - 5 reps - 20 sec hold hold - Supine Bridge with Resistance Band  - 1 x daily - 7 x weekly - 3 sets - 5 reps - Supine Double Knee to Chest Modified  - 1 x daily - 7 x weekly - 2 sets - 8 reps - 5sec hold - Sit to Stand  - 1 x daily - 7 x weekly - 2 sets - 12 reps - Seated Sciatic Tensioner  - 1 x daily - 7 x  weekly - 2 sets - 6 reps  ASSESSMENT:   CLINICAL IMPRESSION: 08/11/2022 ***  *** Pt arrives w/o pain, states she hasn't really had  any pain since last Friday despite doing heavy activities around the house and having some fatigue. Today focusing on progression of strengthening/endurance exercises which pt tolerates quite well, primary report of muscular fatigue. Ended session with significant time spent discussing sciatic nerve glides and appropriate performance, emphasis on desensitization and pain free ROM as she reports primary remaining pain is with prolonged driving. No adverse events, updated HEP to incorporate nerve glides. No pain on departure. If pt continues to progress along current trajectory may consider discharge in coming visits. Pt departs today's session in no acute distress, all voiced questions/concerns addressed appropriately from PT perspective.     EVAL- Patient is a pleasant 47 y.o. woman who was seen today for physical therapy evaluation and treatment for chronic low back pain with LLE referral. 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 Red flag screening reassuring, see above, education provided. On exam concordant pain is elicited with lumbar rotation ROM and knee/hip MMT, mild sensory deficits S1 dermatome and + slump BIL (L more so than R). 5xSTS time indicative of fall risk. Pt endorses improved symptoms after examination, no adverse events. At present would recommend skilled PT to address aforementioned deficits to maximize functional independence. Pt verbalizes agreement/understanding with plan. Pt departs today's session in no acute distress, all voiced questions/concerns addressed appropriately from PT perspective.        OBJECTIVE IMPAIRMENTS: decreased activity tolerance, decreased endurance, decreased mobility, difficulty walking, decreased ROM, decreased  strength, hypomobility, and pain.    ACTIVITY LIMITATIONS: carrying, lifting, bending, sitting, standing, squatting, stairs, transfers, locomotion level, and caring for others   PARTICIPATION LIMITATIONS: meal prep, cleaning, laundry, driving, community activity, occupation, and yard work   PERSONAL FACTORS: Time since onset of injury/illness/exacerbation are also affecting patient's functional outcome.    REHAB POTENTIAL: Good   CLINICAL DECISION MAKING: Evolving/moderate complexity   EVALUATION COMPLEXITY: Moderate     GOALS: Goals reviewed with patient? No   SHORT TERM GOALS: Target date: 07/28/2022   Pt will demonstrate appropriate understanding and performance of initially prescribed HEP in order to facilitate improved independence with management of symptoms.  Baseline: HEP provided on eval 07/28/22: pt reports excellent adherence/tolerance to HEP Goal status: MET   2. Pt will score greater than or equal to MCID on FOTO in order to demonstrate improved perception of function due to symptoms.            Baseline: Deferred on eval, will assess as able/appropriate  07/28/22: reduction in FOTO score by 3 pts            Goal status: ONGOING   LONG TERM GOALS: Target date: 08/25/2022     Pt will meet predicted score on FOTO in order to demonstrate improved perception of functional status due to symptoms.  Baseline: deferred on eval, will assess as able/appropriate Goal status: INITIAL   2.  Pt will demonstrate grossly symmetrical lumbar rotation AROM with less than 2 pt increase in pain on NPS in order to demonstrate improved tolerance to functional movement patterns.  Baseline: see ROM chart above Goal status: INITIAL   3.  Pt will demonstrate improvement in global LLE MMT of at least 1 pt in order to demonstrate improved strength for functional movements.  Baseline: see MMT chart above Goal status: INITIAL   4. Pt will perform 5xSTS in </= 14 sec in order to demonstrate reduced  fall risk and  improved functional independence. (MCID of 2.3sec)            Baseline: 24.96 sec with gentle UE support and increase in pain            Goal status: INITIAL    5. Pt will endorse overall pain reduction of at least 50% for improved tolerance to daily activities.             Baseline: 5-8/10 on NPS            Goal status: INITIAL   6. Pt will be independent w/ final prescribed HEP in order to promote improved self management of symptoms.             Baseline: TBD            Goal status: INITIAL    PLAN:   PT FREQUENCY: 2x/week   PT DURATION: 8 weeks   PLANNED INTERVENTIONS: Therapeutic exercises, Therapeutic activity, Neuromuscular re-education, Balance training, Gait training, Patient/Family education, Self Care, Joint mobilization, Joint manipulation, Stair training, Aquatic Therapy, Dry Needling, Electrical stimulation, Spinal manipulation, Spinal mobilization, Cryotherapy, Moist heat, Taping, Manual therapy, and Re-evaluation.   PLAN FOR NEXT SESSION: incorporate more hip flexor work, functional strengthening/endurance. Look at sciatic nerve glides ***    Ashley Murrain PT, DPT 08/11/2022 1:23 PM

## 2022-08-12 ENCOUNTER — Ambulatory Visit: Payer: Self-pay | Admitting: Physical Therapy

## 2022-08-12 ENCOUNTER — Encounter: Payer: Self-pay | Admitting: Physical Therapy

## 2022-08-12 DIAGNOSIS — R2689 Other abnormalities of gait and mobility: Secondary | ICD-10-CM

## 2022-08-12 DIAGNOSIS — M6281 Muscle weakness (generalized): Secondary | ICD-10-CM

## 2022-08-12 DIAGNOSIS — G8929 Other chronic pain: Secondary | ICD-10-CM

## 2022-10-29 ENCOUNTER — Ambulatory Visit: Payer: Self-pay | Admitting: Internal Medicine

## 2023-08-13 ENCOUNTER — Encounter: Payer: Self-pay | Admitting: Internal Medicine

## 2023-08-13 ENCOUNTER — Ambulatory Visit: Payer: Self-pay | Attending: Internal Medicine | Admitting: Internal Medicine

## 2023-08-13 VITALS — BP 119/79 | HR 66 | Ht 66.0 in | Wt 176.0 lb

## 2023-08-13 DIAGNOSIS — H538 Other visual disturbances: Secondary | ICD-10-CM

## 2023-08-13 DIAGNOSIS — Z6828 Body mass index (BMI) 28.0-28.9, adult: Secondary | ICD-10-CM

## 2023-08-13 DIAGNOSIS — E663 Overweight: Secondary | ICD-10-CM

## 2023-08-13 DIAGNOSIS — R7303 Prediabetes: Secondary | ICD-10-CM

## 2023-08-13 LAB — POCT GLYCOSYLATED HEMOGLOBIN (HGB A1C): HbA1c, POC (prediabetic range): 5.6 % — AB (ref 5.7–6.4)

## 2023-08-13 LAB — GLUCOSE, POCT (MANUAL RESULT ENTRY): POC Glucose: 98 mg/dL (ref 70–99)

## 2023-08-13 NOTE — Patient Instructions (Signed)
 Alimentacin saludable en los [employer], Adult Una alimentacin saludable puede ayudarlo a Barista y Pharmacologist un peso saludable, reducir el riesgo de tener enfermedades crnicas y vivir [place] Dear vida larga y productiva. Es importante que siga una modalidad de alimentacin saludable. Sus necesidades nutricionales y calricas deben satisfacerse principalmente con distintos alimentos ricos en nutrientes. Consejos para seguir Surveyor, minerals Lea las etiquetas de los alimentos Lea las etiquetas y elija las que digan lo siguiente: Productos reducidos en sodio o con bajo contenido de [place]. Jugos con 100 % jugo de fruta. Alimentos con bajo contenido de grasas saturadas (menos de 3 g por porcin) y alto contenido de grasas poliinsaturadas y Mining engineer. Alimentos con cereales integrales, como trigo integral, trigo partido, arroz integral y arroz salvaje. Cereales integrales fortificados con cido flico. Esto se recomienda a las mujeres embarazadas o que desean quedar embarazadas. Lea las etiquetas y no coma ni beba lo siguiente: Alimentos o bebidas con azcar agregada. Estos incluyen los alimentos que contienen azcar moreno, endulzante a base de maz, jarabe de maz, dextrosa, fructosa, glucosa, jarabe de maz de alta fructosa, miel, azcar invertido, lactosa, jarabe de [place], maltosa, Sunflower, azcar sin refinar, sacarosa, trehalosa y azcar turbinado. Limite el consumo de azcar agregada a menos del 10 % del total de caloras diarias. No consuma ms que las siguientes cantidades de azcar agregada por da: 6 cucharaditas (25 g) para las mujeres. 9 cucharaditas (38 g) para los hombres. Los alimentos que contienen almidones y cereales refinados o procesados. Los productos de cereales refinados, como harina blanca, harina de maz desgerminada, pan blanco y arroz blanco. Al ir de compras Elija refrigerios ricos en nutrientes, como verduras, frutas enteras y frutos secos. Evite los refrigerios con  alto contenido de caloras y International aid/development worker, como las papas fritas, los refrigerios frutales y los caramelos. Use alios y productos para untar a base de aceite con los Publishing rights manager de grasas slidas como la [place], la [place], la crema agria o el queso crema. Limite las salsas, las mezclas y los productos "instantneos" preelaborados como el arroz saborizado, los fideos instantneos y las pastas listas para comer. Pruebe ms fuentes de protena vegetal, como tofu, tempeh, frijoles negros, edamame, lentejas, frutos secos y semillas. Explore planes de alimentacin como la dieta mediterrnea o la dieta vegetariana. Pruebe salsas cardiosaludables hechas con frijoles y grasas saludables, como hummus y [place]. Las verduras van muy bien con ellas. Al cocinar Use aceite para Designer, multimedia de grasas slidas como [place], margarina o [place] de [place]. En lugar de frer, trate de cocinar en el horno, en la plancha o en la parrilla, o hervir los alimentos. Retire la parte grasa de las carnes antes de cocinarlas. Cocine las verduras al vapor en agua o caldo. Planificacin de las comidas  En las comidas, imagine dividir su plato en cuartos: La mitad del plato tiene frutas y verduras. Un cuarto del plato tiene cereales integrales. Un cuarto del plato tiene protena, especialmente carnes [place], aves, huevos, tofu, frijoles o frutos secos. Incluya lcteos descremados en su dieta diaria. Estilo de vida Elija opciones saludables en todos los mbitos, como en el hogar, el [place], la [place], los restaurantes y [place]. Prepare los alimentos de un modo seguro: Lvese las manos despus de manipular carnes crudas. Donde prepare alimentos, mantenga las superficies limpias lavndolas regularmente con agua caliente y [place]. Mantenga las carnes crudas separadas de los alimentos que estn listos para comer como las frutas y las verduras. Cocine los frutos  de mar, carnes, aves y Loss adjuster, chartered la temperatura recomendada. Consiga un termmetro para alimentos. Almacene los alimentos a temperaturas seguras. En general: Mantenga los alimentos fros a una temperatura de 40 F (4,4 C) o inferior. Mantenga los alimentos calientes a una temperatura de 140 F (60 C) o superior. Mantenga el congelador a una temperatura de 0 F (-17,8 C) o inferior. Los alimentos no son seguros para su consumo cuando han estado a una temperatura de entre 40 y 140 F (4.4 y 60 C) por ms de 2 horas. Qu alimentos debo comer? Frutas Propngase comer entre 1 y 2 tazas de frutas frescas, Primary school teacher (en su jugo natural) o Primary school teacher. Una taza de fruta equivale a 1 manzana pequea, 1 banana grande, 8 fresas grandes, 1 taza (237 g) de fruta enlatada,  taza (82 g) de fruta seca o 1 taza (240 ml) de jugo al 100 %. Verduras Propngase comer de 2 a 4 tazas de verduras frescas y Primary school teacher, incluyendo diferentes variedades y colores. Una taza de verduras equivale a 1 taza (91 g) de brcoli o coliflor, 2 zanahorias medianas, 2 tazas (150 g) de verduras de [employer] crudas, 1 tomate grande, 1 pimiento morrn grande, 1 batata grande o 1 patata blanca mediana. Cereales Propngase comer el equivalente a entre 4 y 10 onzas de cereales integrales por Futures trader. Algunos ejemplos de equivalentes a 1 onza de cereales son 1 rebanada de pan, 1 taza (40 g) de cereal listo para comer, 3 tazas (24 g) de palomitas de maz o  taza (93 g) de arroz cocido. Carnes y otras protenas Propngase comer el equivalente a entre 5 y 7  onzas de protena por Futures trader. Algunos ejemplos de equivalentes a 1 onza de protenas incluyen 1 huevo,  oz de frutos secos (12 almendras, 24 pistachos o 7 mitades de nueces), 1/4 taza (90 g) de frijoles cocidos, 6 cucharadas (90 g) de hummus o 1 cucharada (16 g) de [place] de man. Un corte de carne o pescado del tamao de un mazo de cartas equivale aproximadamente a 3 a 4 onzas (85  g). De las protenas que consume cada semana, intente que al menos 8 onzas (227 g) sean frutos de mar. Esto equivale a unas 2 porciones por semana. Esto incluye salmn, trucha, arenque y anchoas. Lcteos [employer] a 3 tazas de lcteos descremados o con bajo contenido de Museum/gallery curator. Algunos ejemplos de equivalentes a 1 taza de lcteos son 1 taza (240 ml) de leche, 8 onzas (250 g) de yogur, 1 onzas (44 g) de queso natural o 1 taza (240 ml) de leche de soja fortificada. Grasas y aceites Propngase consumir alrededor de 5 cucharaditas (21 g) de grasas y Acupuncturist. Elija grasas monoinsaturadas, como el aceite de canola y de [place], la [place] con aceite de [place] o de [place], [place], [place] de man y Games developer de los frutos secos, o bien grasas poliinsaturadas, como el aceite de [place], maz y soja, nueces, piones, semillas de ssamo, semillas de girasol y semillas de lino. Bebidas Propngase beber 6 vasos de 8 onzas de Warehouse manager. Limite el caf a entre 3 y 5 tazas de ocho onzas por Futures trader. Limite el consumo de bebidas con cafena que tengan caloras agregadas, como los refrescos y las bebidas energizantes. Si bebe alcohol: Limite la cantidad que bebe a lo siguiente: De 0 a 1 medida al da si es [place]. De 0 a 2 medidas  al da si es varn. Sepa cunta cantidad de alcohol hay en las bebidas que toma. En los [address], una medida es una botella de cerveza de 12 oz (355 ml), un vaso de vino de 5 oz (148 ml) o un vaso de una bebida alcohlica de alta graduacin de 1 oz (44 ml). Condimentos y otros alimentos Trate de no agregar demasiada sal a los alimentos. Trate de usar hierbas y especias en lugar de sal. Trate de no agregar azcar a los alimentos. Esta informacin se basa en las pautas de nutricin de los [place]. Para obtener ms informacin, visite [email or web address]. Las Information systems manager. Es posible que necesite cantidades  diferentes. Esta informacin no tiene Theme park manager el consejo del mdico. Asegrese de hacerle al mdico cualquier pregunta que tenga. Document Revised: 02/03/2022 Document Reviewed: 02/03/2022 Elsevier Patient Education  2024 [employer].

## 2023-08-13 NOTE — Progress Notes (Signed)
 Patient ID: Tracy Blevins, female    DOB: 1976-04-12  MRN: [id number]  CC: Hyperglycemia (Pre-diabetes f/u./Intermittent blurry vision )   Subjective: Tracy Blevins is a 48 y.o. female who presents for chronic ds management. Her concerns today include:  PreDM, Meniere's  AMN Language interpreter used during this encounter. #Jennifer, [id number]  Pt last seen here 1 yr ago. Hx of PreDM. Came for recheck. A1C 5.6/BS 98 today Reports she does well with eating habits - avoids sugar drinks and snacks. Not exercising but just moved into a new house/area. Plans to start exercising Reports blurry vision if she delays eating on time.  No dizziness with it. Wears glasses.  Last eye exam was 2 yrs ago. Patient Active Problem List   Diagnosis Date Noted   Chronic left-sided low back pain with left-sided sciatica 02/10/2022   Irregular periods 02/10/2022   Influenza vaccine needed 07/18/2020   Need for tetanus, diphtheria, and acellular pertussis (Tdap) vaccine 07/18/2020   Screening breast examination 02/14/2019   Breast pain, right 02/14/2019     Current Outpatient Medications on File Prior to Visit  Medication Sig Dispense Refill   diclofenac  (VOLTAREN ) 75 MG EC tablet Take 1 tablet (75 mg total) by mouth 2 (two) times daily as needed for pain (Patient not taking: Reported on 08/13/2023) 30 tablet 0   diclofenac  Sodium (VOLTAREN ) 1 % GEL Apply 2 g topically 4 (four) times daily. (Patient not taking: Reported on 08/13/2023) 100 g 1   hydrocortisone  (ANUSOL -HC) 25 MG suppository Place 1 suppository (25 mg total) rectally 2 (two) times daily. (Patient not taking: Reported on 08/13/2023) 12 suppository 1   meclizine  (ANTIVERT ) 25 MG tablet Take 25 mg by mouth 3 (three) times daily as needed. (Patient not taking: Reported on 08/13/2023)     ondansetron (ZOFRAN-ODT) 4 MG disintegrating tablet Take 4 mg by mouth every 8 (eight) hours as needed. (Patient not taking: Reported on 08/13/2023)      polyethylene glycol powder (GLYCOLAX /MIRALAX ) 17 GM/SCOOP powder Mix 17 g with 4-8 ounces of a beverage and take by mouth 2 (two) times daily as needed. (Patient not taking: Reported on 08/13/2023) 510 g 1   vitamin E 100 UNIT capsule Take by mouth daily. (Patient not taking: Reported on 08/13/2023)     Current Facility-Administered Medications on File Prior to Visit  Medication Dose Route Frequency Provider Last Rate Last Admin   0.9 %  sodium chloride  infusion  500 mL Intravenous Once Asencion Blacksmith, MD        Allergies  Allergen Reactions   Hydrocodone Nausea And Vomiting and Other (See Comments)    Dizziness     Social History   Socioeconomic History   Marital status: Married    Spouse name: Not on file   Number of children: 4   Years of education: Not on file   Highest education level: Not on file  Occupational History   Not on file  Tobacco Use   Smoking status: Never   Smokeless tobacco: Never  Vaping Use   Vaping status: Never Used  Substance and Sexual Activity   Alcohol use: No   Drug use: No   Sexual activity: Yes    Birth control/protection: Surgical    Comment: 02/04/2007: Tubal ligation  Other Topics Concern   Not on file  Social History Narrative   Not on file   Social Drivers of Health   Financial Resource Strain: Not on file  Food Insecurity: No  Food Insecurity (06/04/2022)   Hunger Vital Sign    Worried About Running Out of Food in the Last Year: Never true    Ran Out of Food in the Last Year: Never true  Transportation Needs: No Transportation Needs (06/04/2022)   PRAPARE - Administrator, Civil Service (Medical): No    Lack of Transportation (Non-Medical): No  Physical Activity: Not on file  Stress: Not on file  Social Connections: Unknown (04/30/2022)   Received from [facility]   Social Network    Social Network: Not on file  Intimate Partner Violence: Unknown (04/30/2022)   Received from [facility]   HITS    Physically Hurt: Not on file    Insult or Talk Down To: Not on file    Threaten Physical Harm: Not on file    Scream or Curse: Not on file    Family History  Problem Relation Age of Onset   Hypertension Mother    Diabetes Mother    Hypertension Father    Uterine cancer Maternal Grandmother    Breast cancer Neg Hx     Past Surgical History:  Procedure Laterality Date   APPENDECTOMY  2007   CESAREAN SECTION  2000   CESAREAN SECTION  2008   TUBAL LIGATION  2008    ROS: Review of Systems Negative except as stated above  PHYSICAL EXAM: BP 119/79 (BP Location: Left Arm, Patient Position: Sitting, Cuff Size: Normal)   Pulse 66   Ht 5\' 6"  (1.676 m)   Wt 176 lb (79.8 kg)   SpO2 99%   BMI 28.41 kg/m   Physical Exam  General appearance - alert, well appearing, middle-age Hispanic female and in no distress Mental status - alert, oriented to person, place, and time Eyes - pupils equal and reactive, extraocular eye movements intact Mouth - mucous membranes moist, pharynx normal without lesions Chest - clear to auscultation, no wheezes, rales or rhonchi, symmetric air entry Heart - normal rate, regular rhythm, normal S1, S2, no murmurs, rubs, clicks or gallops      Latest Ref Rng & Units 07/29/2022    9:50 AM 02/09/2022    2:32 PM 07/22/2017    3:23 PM  CMP  Glucose 70 - 99 mg/dL 84  161  79   BUN 6 - 24 mg/dL 16  16  14    Creatinine 0.57 - 1.00 mg/dL 0.96  0.45  4.09   Sodium 134 - 144 mmol/L 139  140  142   Potassium 3.5 - 5.2 mmol/L 4.8  4.3  3.9   Chloride 96 - 106 mmol/L 105  102  104   CO2 20 - 29 mmol/L 21  25  23    Calcium 8.7 - 10.2 mg/dL 8.8  9.0  8.7   Total Protein 6.0 - 8.5 g/dL   6.6   Total Bilirubin 0.0 - 1.2 mg/dL   0.3   Alkaline Phos 39 - 117 IU/L   48   AST 0 - 40 IU/L   14   ALT 0 - 32 IU/L   14    Lipid Panel  No results found for: "CHOL", "TRIG", "HDL", "CHOLHDL", "VLDL", "LDLCALC", "LDLDIRECT"  CBC    Component Value  Date/Time   WBC 6.6 02/09/2022 1432   WBC 7.6 06/18/2016 2204   RBC 4.19 02/09/2022 1432   RBC 4.05 06/18/2016 2204   HGB 11.5 02/09/2022 1432   HCT 35.8 02/09/2022 1432   PLT  315 02/09/2022 1432   MCV 85 02/09/2022 1432   MCH 27.4 02/09/2022 1432   MCH 27.7 06/18/2016 2204   MCHC 32.1 02/09/2022 1432   MCHC 32.9 06/18/2016 2204   RDW 15.4 02/09/2022 1432   LYMPHSABS 1.9 02/09/2022 1432   MONOABS 1.2 (H) 01/23/2011 1904   EOSABS 0.1 02/09/2022 1432   BASOSABS 0.0 02/09/2022 1432    ASSESSMENT AND PLAN: 1. Prediabetes (Primary) No longer in the range for prediabetes.  Commended her on healthy eating habits.  Keep up the good work.  Encouraged her to get in some form of moderate intensity exercise with goal of 150 minutes/week total - POCT glycosylated hemoglobin (Hb A1C) - POCT glucose (manual entry) - CBC  2. Overweight (BMI 25.0-29.9) See #1 above - CBC - Comprehensive metabolic panel with GFR - Lipid panel  3. Blurred vision Recommend trying to eat meals on time. Recommend getting routine eye exam once every 2 years.  She plans to schedule one with an optometrist.   Patient was given the opportunity to ask questions.  Patient verbalized understanding of the plan and was able to repeat key elements of the plan.   This documentation was completed using Paediatric nurse.  Any transcriptional errors are unintentional.  Orders Placed This Encounter  Procedures   CBC   Comprehensive metabolic panel with GFR   Lipid panel   POCT glycosylated hemoglobin (Hb A1C)   POCT glucose (manual entry)     Requested Prescriptions    No prescriptions requested or ordered in this encounter    Return if symptoms worsen or fail to improve.  Tracy Dee, MD, FACP

## 2023-08-14 ENCOUNTER — Other Ambulatory Visit: Payer: Self-pay

## 2023-08-14 ENCOUNTER — Other Ambulatory Visit: Payer: Self-pay | Admitting: Internal Medicine

## 2023-08-14 DIAGNOSIS — E782 Mixed hyperlipidemia: Secondary | ICD-10-CM | POA: Insufficient documentation

## 2023-08-14 DIAGNOSIS — D509 Iron deficiency anemia, unspecified: Secondary | ICD-10-CM | POA: Insufficient documentation

## 2023-08-14 LAB — COMPREHENSIVE METABOLIC PANEL WITH GFR
ALT: 16 IU/L (ref 0–32)
AST: 19 IU/L (ref 0–40)
Albumin: 4.5 g/dL (ref 3.9–4.9)
Alkaline Phosphatase: 66 IU/L (ref 44–121)
BUN/Creatinine Ratio: 25 — ABNORMAL HIGH (ref 9–23)
BUN: 16 mg/dL (ref 6–24)
Bilirubin Total: 0.5 mg/dL (ref 0.0–1.2)
CO2: 23 mmol/L (ref 20–29)
Calcium: 9.4 mg/dL (ref 8.7–10.2)
Chloride: 104 mmol/L (ref 96–106)
Creatinine, Ser: 0.65 mg/dL (ref 0.57–1.00)
Globulin, Total: 2.8 g/dL (ref 1.5–4.5)
Glucose: 91 mg/dL (ref 70–99)
Potassium: 4.9 mmol/L (ref 3.5–5.2)
Sodium: 140 mmol/L (ref 134–144)
Total Protein: 7.3 g/dL (ref 6.0–8.5)
eGFR: 110 mL/min/{1.73_m2} (ref >59)

## 2023-08-14 LAB — LIPID PANEL
Chol/HDL Ratio: 4 ratio (ref 0.0–4.4)
Cholesterol, Total: 267 mg/dL — ABNORMAL HIGH (ref 100–199)
HDL: 67 mg/dL (ref >39)
LDL Chol Calc (NIH): 186 mg/dL — ABNORMAL HIGH (ref 0–99)
Triglycerides: 86 mg/dL (ref 0–149)
VLDL Cholesterol Cal: 14 mg/dL (ref 5–40)

## 2023-08-14 LAB — CBC
Hematocrit: 31.9 % — ABNORMAL LOW (ref 34.0–46.6)
Hemoglobin: 10 g/dL — ABNORMAL LOW (ref 11.1–15.9)
MCH: 24.6 pg — ABNORMAL LOW (ref 26.6–33.0)
MCHC: 31.3 g/dL — ABNORMAL LOW (ref 31.5–35.7)
MCV: 78 fL — ABNORMAL LOW (ref 79–97)
Platelets: 399 10*3/uL (ref 150–450)
RBC: 4.07 x10E6/uL (ref 3.77–5.28)
RDW: 16.1 % — ABNORMAL HIGH (ref 11.7–15.4)
WBC: 6.3 10*3/uL (ref 3.4–10.8)

## 2023-08-14 MED ORDER — FERROUS SULFATE 325 (65 FE) MG PO TABS
325.0000 mg | ORAL_TABLET | Freq: Every day | ORAL | 1 refills | Status: AC
  Filled 2023-08-14: qty 100, 100d supply, fill #0
  Filled 2024-01-07: qty 100, 100d supply, fill #1

## 2023-08-14 NOTE — Progress Notes (Signed)
 Let patient know that she has become anemic again.  I recommend taking iron supplement daily.  Prescription has been sent to our pharmacy. Return to lab in 6 weeks for recheck. Please find out how long does her menstrual normally last and is her bleeding light, moderate, or heavy. -Kidney and liver function tests normal -Cholesterol level elevated at 186 with goal being less than 100.  Regular exercise as discussed on recent visit and healthy eating habits will help to lower cholesterol.  Avoid eating fried foods and fatty foods.  Incorporate fresh fruits and vegetables into the diet daily.  If she eats meat, try to eat more lean meat like chicken [place] and seafood.  Less beef less pork.  Cook with olive oil.  The rest of this is  for my information.    The 10-year ASCVD risk score (Arnett DK, et al., 2019) is: 0.9%   Values used to calculate the score:     Age: 48 years     Sex: Female     Is Non-Hispanic African American: No     Diabetic: No     Tobacco smoker: No     Systolic Blood Pressure: 119 mmHg     Is BP treated: No     HDL Cholesterol: 67 mg/dL     Total Cholesterol: 267 mg/dL

## 2023-08-15 ENCOUNTER — Other Ambulatory Visit: Payer: Self-pay

## 2023-08-16 ENCOUNTER — Telehealth: Payer: Self-pay | Admitting: Internal Medicine

## 2023-08-16 NOTE — Telephone Encounter (Signed)
Called patient but no answer. LVM to call back.

## 2023-08-16 NOTE — Telephone Encounter (Signed)
 Pt called back from 4/28 lab results

## 2023-08-20 ENCOUNTER — Other Ambulatory Visit: Payer: Self-pay

## 2023-09-30 ENCOUNTER — Other Ambulatory Visit: Payer: Self-pay

## 2023-11-28 NOTE — Progress Notes (Deleted)
   Established Patient Office Visit  Subjective   Patient ID: Tracy Blevins, female    DOB: Jul 02, 1975  Age: 48 y.o. MRN: [id number]  No chief complaint on file.   F/u infection  pcp johnson Last ov 07/2023 Prediabetes (Primary) No longer in the range for prediabetes.  Commended her on healthy eating habits.  Keep up the good work.  Encouraged her to get in some form of moderate intensity exercise with goal of 150 minutes/week total - POCT glycosylated hemoglobin (Hb A1C) - POCT glucose (manual entry) - CBC   2. Overweight (BMI 25.0-29.9) See #1 above - CBC - Comprehensive metabolic panel with GFR - Lipid panel   3. Blurred vision Recommend trying to eat meals on time. Recommend getting routine eye exam once every 2 years.  She plans to schedule one with an optometrist.        {History (Optional):23778}  ROS    Objective:     There were no vitals taken for this visit. {Vitals History (Optional):23777}  Physical Exam   No results found for any visits on 12/02/23.  {Labs (Optional):23779}  The 10-year ASCVD risk score (Arnett DK, et al., 2019) is: 0.9%    Assessment & Plan:   Problem List Items Addressed This Visit   None   No follow-ups on file.    Belvie Silvan, MD

## 2023-12-02 ENCOUNTER — Ambulatory Visit: Payer: Self-pay | Admitting: Critical Care Medicine

## 2023-12-02 ENCOUNTER — Other Ambulatory Visit: Payer: Self-pay | Admitting: Internal Medicine

## 2023-12-02 DIAGNOSIS — Z1231 Encounter for screening mammogram for malignant neoplasm of breast: Secondary | ICD-10-CM

## 2023-12-28 ENCOUNTER — Ambulatory Visit: Payer: Self-pay

## 2023-12-30 ENCOUNTER — Inpatient Hospital Stay
Admission: RE | Admit: 2023-12-30 | Discharge: 2023-12-30 | Discharge status: Home / Self Care | Source: Ambulatory Visit | Attending: Internal Medicine | Admitting: Internal Medicine

## 2023-12-30 DIAGNOSIS — Z1231 Encounter for screening mammogram for malignant neoplasm of breast: Secondary | ICD-10-CM

## 2024-01-03 ENCOUNTER — Ambulatory Visit: Payer: Self-pay | Admitting: Internal Medicine

## 2024-01-06 ENCOUNTER — Telehealth: Payer: Self-pay | Admitting: Internal Medicine

## 2024-01-06 NOTE — Telephone Encounter (Signed)
 Lvm to confirm appt for 9/19

## 2024-01-07 ENCOUNTER — Encounter: Payer: Self-pay | Admitting: Internal Medicine

## 2024-01-07 ENCOUNTER — Ambulatory Visit: Payer: Self-pay | Attending: Internal Medicine | Admitting: Internal Medicine

## 2024-01-07 ENCOUNTER — Other Ambulatory Visit: Payer: Self-pay

## 2024-01-07 VITALS — BP 125/80 | HR 57 | Temp 98.2°F | Ht 66.0 in | Wt 176.0 lb

## 2024-01-07 DIAGNOSIS — Z23 Encounter for immunization: Secondary | ICD-10-CM | POA: Diagnosis not present

## 2024-01-07 DIAGNOSIS — D500 Iron deficiency anemia secondary to blood loss (chronic): Secondary | ICD-10-CM | POA: Diagnosis not present

## 2024-01-07 DIAGNOSIS — E782 Mixed hyperlipidemia: Secondary | ICD-10-CM | POA: Diagnosis not present

## 2024-01-07 NOTE — Progress Notes (Signed)
 Patient ID: Tracy Blevins, female    DOB: 22-Jan-1976  MRN: [id number]  CC: Follow-up (Follow-up. /Discuss if iron is still needed. /Flu vax administered on 01/07/2024 - C.A.)   Subjective: Tracy Blevins is a 48 y.o. female who presents for chronic ds management. Her concerns today include:  PreDM, Meniere's, HL, anemia  AMN Language interpreter used during this encounter. #Jorge [id number]    Discussed the use of AI scribe software for clinical note transcription with the patient, who gave verbal consent to proceed.  History of Present Illness Tracy Blevins is a 48 year old female with iron deficiency anemia who presents for a follow-up visit.  She was last seen in April when blood tests indicated a recurrence of anemia. She has a history of iron deficiency anemia and completed one bottle of iron supplements, which was a little over a 90-day supply, but did not realize she had another refill available.  She experiences occasional dizziness, which is less frequent than before. She continues to have menstrual cycles lasting eight days with moderate bleeding. No blood in stools or black stools.  She has a history of prediabetes and elevated cholesterol. A1C on last visit had normalized. She is monitoring her diet by avoiding fried foods, greasy foods, sodas, and sweets. She cooks with olive oil or vegan butter. For exercise, she walks on her property once or twice a week for 35 minutes each session.    Patient Active Problem List   Diagnosis Date Noted   Iron deficiency anemia 08/14/2023   Hyperlipemia, mixed 08/14/2023   Chronic left-sided low back pain with left-sided sciatica 02/10/2022   Irregular periods 02/10/2022   Influenza vaccine needed 07/18/2020   Need for tetanus, diphtheria, and acellular pertussis (Tdap) vaccine 07/18/2020   Screening breast examination 02/14/2019   Breast pain, right 02/14/2019     Current Outpatient Medications on File Prior to  Visit  Medication Sig Dispense Refill   ferrous sulfate  325 (65 FE) MG tablet Take 1 tablet (325 mg total) by mouth daily with breakfast. 100 tablet 1   meclizine  (ANTIVERT ) 25 MG tablet Take 25 mg by mouth 3 (three) times daily as needed. (Patient not taking: Reported on 01/07/2024)     Current Facility-Administered Medications on File Prior to Visit  Medication Dose Route Frequency Provider Last Rate Last Admin   0.9 %  sodium chloride  infusion  500 mL Intravenous Once Aneita Gwendlyn DASEN, MD        Allergies  Allergen Reactions   Hydrocodone Nausea And Vomiting and Other (See Comments)    Dizziness     Social History   Socioeconomic History   Marital status: Married    Spouse name: Not on file   Number of children: 4   Years of education: Not on file   Highest education level: Not on file  Occupational History   Not on file  Tobacco Use   Smoking status: Never   Smokeless tobacco: Never  Vaping Use   Vaping status: Never Used  Substance and Sexual Activity   Alcohol use: No   Drug use: No   Sexual activity: Yes    Birth control/protection: Surgical    Comment: 02/04/2007: Tubal ligation  Other Topics Concern   Not on file  Social History Narrative   Not on file   Social Drivers of Health   Financial Resource Strain: Low Risk  (01/07/2024)   Overall Financial Resource Strain (CARDIA)    Difficulty  of Paying Living Expenses: Not very hard  Food Insecurity: Food Insecurity Present (01/07/2024)   Hunger Vital Sign    Worried About Running Out of Food in the Last Year: Never true    Ran Out of Food in the Last Year: Sometimes true  Transportation Needs: No Transportation Needs (01/07/2024)   PRAPARE - Administrator, Civil Service (Medical): No    Lack of Transportation (Non-Medical): No  Physical Activity: Insufficiently Active (01/07/2024)   Exercise Vital Sign    Days of Exercise per Week: 2 days    Minutes of Exercise per Session: 30 min  Stress: No  Stress Concern Present (01/07/2024)   [employer] of Occupational Health - Occupational Stress Questionnaire    Feeling of Stress: Only a little  Social Connections: Unknown (04/30/2022)   Received from [facility]   Social Network    Social Network: Not on file  Intimate Partner Violence: Not At Risk (01/07/2024)   Humiliation, Afraid, Rape, and Kick questionnaire    Fear of Current or Ex-Partner: No    Emotionally Abused: No    Physically Abused: No    Sexually Abused: No    Family History  Problem Relation Age of Onset   Hypertension Mother    Diabetes Mother    Hypertension Father    Uterine cancer Maternal Grandmother    Breast cancer Neg Hx     Past Surgical History:  Procedure Laterality Date   APPENDECTOMY  2007   CESAREAN SECTION  2000   CESAREAN SECTION  2008   TUBAL LIGATION  2008    ROS: Review of Systems Negative except as stated above  PHYSICAL EXAM: BP 125/80 (BP Location: Left Arm, Patient Position: Sitting, Cuff Size: Normal)   Pulse (!) 57   Temp 98.2 F (36.8 C) (Oral)   Ht 5' 6 (1.676 m)   Wt 176 lb (79.8 kg)   SpO2 100%   BMI 28.41 kg/m   Wt Readings from Last 3 Encounters:  01/07/24 176 lb (79.8 kg)  08/13/23 176 lb (79.8 kg)  07/29/22 171 lb (77.6 kg)    Physical Exam  General appearance - alert, well appearing, middle age Hispanic female and in no distress Mental status - normal mood, behavior, speech, dress, motor activity, and thought processes Chest - clear to auscultation, no wheezes, rales or rhonchi, symmetric air entry Heart - normal rate, regular rhythm, normal S1, S2, no murmurs, rubs, clicks or gallops Extremities - peripheral pulses normal, no pedal edema, no clubbing or cyanosis      Latest Ref Rng & Units 08/13/2023   11:24 AM 07/29/2022    9:50 AM 02/09/2022    2:32 PM  CMP  Glucose 70 - 99 mg/dL 91  84  865   BUN 6 - 24 mg/dL 16  16  16    Creatinine 0.57 - 1.00 mg/dL 9.34  9.39  9.45   Sodium 134 - 144  mmol/L 140  139  140   Potassium 3.5 - 5.2 mmol/L 4.9  4.8  4.3   Chloride 96 - 106 mmol/L 104  105  102   CO2 20 - 29 mmol/L 23  21  25    Calcium 8.7 - 10.2 mg/dL 9.4  8.8  9.0   Total Protein 6.0 - 8.5 g/dL 7.3     Total Bilirubin 0.0 - 1.2 mg/dL 0.5     Alkaline Phos 44 - 121 IU/L 66     AST 0 - 40 IU/L  19     ALT 0 - 32 IU/L 16      Lipid Panel     Component Value Date/Time   CHOL 267 (H) 08/13/2023 1124   TRIG 86 08/13/2023 1124   HDL 67 08/13/2023 1124   CHOLHDL 4.0 08/13/2023 1124   LDLCALC 186 (H) 08/13/2023 1124   The 10-year ASCVD risk score (Arnett DK, et al., 2019) is: 1%   Values used to calculate the score:     Age: 89 years     Clincally relevant sex: Female     Is Non-Hispanic African American: No     Diabetic: No     Tobacco smoker: No     Systolic Blood Pressure: 125 mmHg     Is BP treated: No     HDL Cholesterol: 67 mg/dL     Total Cholesterol: 267 mg/dL  CBC    Component Value Date/Time   WBC 6.3 08/13/2023 1124   WBC 7.6 06/18/2016 2204   RBC 4.07 08/13/2023 1124   RBC 4.05 06/18/2016 2204   HGB 10.0 (L) 08/13/2023 1124   HCT 31.9 (L) 08/13/2023 1124   PLT 399 08/13/2023 1124   MCV 78 (L) 08/13/2023 1124   MCH 24.6 (L) 08/13/2023 1124   MCH 27.7 06/18/2016 2204   MCHC 31.3 (L) 08/13/2023 1124   MCHC 32.9 06/18/2016 2204   RDW 16.1 (H) 08/13/2023 1124   LYMPHSABS 1.9 02/09/2022 1432   MONOABS 1.2 (H) 01/23/2011 1904   EOSABS 0.1 02/09/2022 1432   BASOSABS 0.0 02/09/2022 1432    ASSESSMENT AND PLAN: 1. Iron deficiency anemia due to chronic blood loss (Primary) Likely due to menses. Recheck CBC and iron studies today. - CBC - Iron, TIBC and Ferritin Panel  2. Hyperlipemia, mixed Encouraged her to continue healthy eating habits and regular exercise.  No need for medication at this time.  3. Influenza vaccine needed - Flu vaccine trivalent PF, 6mos and older(Flulaval,Afluria,Fluarix,Fluzone)    Patient was given the opportunity to  ask questions.  Patient verbalized understanding of the plan and was able to repeat key elements of the plan.   This documentation was completed using Paediatric nurse.  Any transcriptional errors are unintentional.  Orders Placed This Encounter  Procedures   Flu vaccine trivalent PF, 6mos and older(Flulaval,Afluria,Fluarix,Fluzone)   CBC   Iron, TIBC and Ferritin Panel     Requested Prescriptions    No prescriptions requested or ordered in this encounter    Return in about 6 months (around 07/06/2024).  Barnie Louder, MD, FACP

## 2024-01-08 ENCOUNTER — Ambulatory Visit: Payer: Self-pay | Admitting: Internal Medicine

## 2024-01-08 DIAGNOSIS — D500 Iron deficiency anemia secondary to blood loss (chronic): Secondary | ICD-10-CM

## 2024-01-08 LAB — IRON,TIBC AND FERRITIN PANEL
Ferritin: 22 ng/mL (ref 15–150)
Iron Saturation: 14 % — ABNORMAL LOW (ref 15–55)
Iron: 45 ug/dL (ref 27–159)
Total Iron Binding Capacity: 320 ug/dL (ref 250–450)
UIBC: 275 ug/dL (ref 131–425)

## 2024-01-08 LAB — CBC
Hematocrit: 40.1 % (ref 34.0–46.6)
Hemoglobin: 12.6 g/dL (ref 11.1–15.9)
MCH: 28.8 pg (ref 26.6–33.0)
MCHC: 31.4 g/dL — ABNORMAL LOW (ref 31.5–35.7)
MCV: 92 fL (ref 79–97)
Platelets: 349 x10E3/uL (ref 150–450)
RBC: 4.38 x10E6/uL (ref 3.77–5.28)
RDW: 13.3 % (ref 11.7–15.4)
WBC: 5.5 x10E3/uL (ref 3.4–10.8)

## 2024-01-08 NOTE — Progress Notes (Signed)
 Blood cell count has normalized but iron level and iron stores are still low.  I see that she picked up a refill on the iron supplement yesterday.  Once she has completed the current bottle, please return to the lab to have levels rechecked.  Orders entered.

## 2024-02-11 ENCOUNTER — Ambulatory Visit: Attending: Internal Medicine

## 2024-02-11 ENCOUNTER — Other Ambulatory Visit: Payer: Self-pay

## 2024-02-11 DIAGNOSIS — D500 Iron deficiency anemia secondary to blood loss (chronic): Secondary | ICD-10-CM

## 2024-02-12 ENCOUNTER — Ambulatory Visit: Payer: Self-pay | Admitting: Internal Medicine

## 2024-02-12 LAB — CBC
Hematocrit: 39.2 % (ref 34.0–46.6)
Hemoglobin: 12.5 g/dL (ref 11.1–15.9)
MCH: 29.1 pg (ref 26.6–33.0)
MCHC: 31.9 g/dL (ref 31.5–35.7)
MCV: 91 fL (ref 79–97)
Platelets: 359 x10E3/uL (ref 150–450)
RBC: 4.29 x10E6/uL (ref 3.77–5.28)
RDW: 13.1 % (ref 11.7–15.4)
WBC: 5.2 x10E3/uL (ref 3.4–10.8)

## 2024-02-12 LAB — IRON,TIBC AND FERRITIN PANEL
Ferritin: 33 ng/mL (ref 15–150)
Iron Saturation: 30 % (ref 15–55)
Iron: 79 ug/dL (ref 27–159)
Total Iron Binding Capacity: 267 ug/dL (ref 250–450)
UIBC: 188 ug/dL (ref 131–425)

## 2024-07-10 ENCOUNTER — Ambulatory Visit: Admitting: Internal Medicine
